# Patient Record
Sex: Female | Born: 1970 | Race: White | Hispanic: No | Marital: Single | Smoking: Never smoker
Health system: Southern US, Community
[De-identification: ages and names within clinical notes are randomized; demographics above are authoritative.]

## PROBLEM LIST (undated history)

## (undated) DIAGNOSIS — J329 Chronic sinusitis, unspecified: Secondary | ICD-10-CM

## (undated) DIAGNOSIS — E049 Nontoxic goiter, unspecified: Secondary | ICD-10-CM

## (undated) DIAGNOSIS — K635 Polyp of colon: Secondary | ICD-10-CM

## (undated) DIAGNOSIS — E039 Hypothyroidism, unspecified: Secondary | ICD-10-CM

## (undated) DIAGNOSIS — E739 Lactose intolerance, unspecified: Secondary | ICD-10-CM

## (undated) HISTORY — DX: Chronic sinusitis, unspecified: J32.9

## (undated) HISTORY — DX: Lactose intolerance, unspecified: E73.9

## (undated) HISTORY — DX: Nontoxic goiter, unspecified: E04.9

## (undated) HISTORY — PX: APPENDECTOMY: SHX54

## (undated) HISTORY — DX: Polyp of colon: K63.5

## (undated) HISTORY — PX: TOTAL THYROIDECTOMY: SHX2547

## (undated) HISTORY — DX: Hypothyroidism, unspecified: E03.9

## (undated) HISTORY — PX: COLONOSCOPY: SHX174

---

## 1996-03-11 DIAGNOSIS — O321XX0 Maternal care for breech presentation, not applicable or unspecified: Secondary | ICD-10-CM

## 2002-10-05 LAB — HM COLONOSCOPY

## 2007-03-29 LAB — HM DEXA SCAN: HM Dexa Scan: NORMAL

## 2009-10-14 ENCOUNTER — Ambulatory Visit: Payer: Self-pay

## 2009-10-16 ENCOUNTER — Ambulatory Visit: Payer: Self-pay

## 2009-11-04 ENCOUNTER — Ambulatory Visit: Payer: Self-pay | Admitting: Internal Medicine

## 2015-03-26 ENCOUNTER — Ambulatory Visit: Payer: Self-pay

## 2015-04-02 ENCOUNTER — Ambulatory Visit
Admission: RE | Admit: 2015-04-02 | Discharge: 2015-04-02 | Disposition: A | Discharge status: Home / Self Care | Payer: Self-pay | Source: Ambulatory Visit | Attending: Oncology | Admitting: Oncology

## 2015-04-02 ENCOUNTER — Ambulatory Visit: Payer: Self-pay | Attending: Oncology

## 2015-04-02 VITALS — BP 112/76 | HR 75 | Temp 98.5°F | Ht 62.6 in | Wt 164.2 lb

## 2015-04-02 DIAGNOSIS — Z0000 Encounter for general adult medical examination without abnormal findings: Secondary | ICD-10-CM

## 2015-04-02 NOTE — Progress Notes (Signed)
Subjective:     Patient ID: Toni Morris, female   DOB: Dec 06, 1970, 45 y.o.   MRN: [id number]  HPI   Review of Systems     Objective:   Physical Exam  Pulmonary/Chest: Right breast exhibits no inverted nipple, no mass, no nipple discharge, no skin change and no tenderness. Left breast exhibits no inverted nipple, no mass, no nipple discharge, no skin change and no tenderness. Breasts are symmetrical.       Assessment:     45 year old patient presents for [facility] clinic visit. Patient screened, and meets BCCCP eligibility.  Patient does not have insurance, Medicare or Medicaid.  Handout given on Affordable Care Act. Instructed patient on breast self-exam using teach back method. CBE unremarkable. No mass or lump palpated.  Patient's mother was diagnosed with breast cancer at age 102 .      Plan:     Sent for bilateral screening mammogram.

## 2015-04-03 NOTE — Progress Notes (Signed)
Letter mailed from [facility] to notify of normal mammogram results.  Patient to return in one year for annual screening.  Copy to [facility]. 

## 2016-04-27 ENCOUNTER — Encounter: Payer: Self-pay | Admitting: Certified Nurse Midwife

## 2016-04-27 ENCOUNTER — Ambulatory Visit (INDEPENDENT_AMBULATORY_CARE_PROVIDER_SITE_OTHER): Payer: Self-pay | Admitting: Certified Nurse Midwife

## 2016-04-27 VITALS — BP 120/80 | HR 70 | Ht 60.0 in | Wt 170.0 lb

## 2016-04-27 DIAGNOSIS — Z01419 Encounter for gynecological examination (general) (routine) without abnormal findings: Secondary | ICD-10-CM

## 2016-04-27 DIAGNOSIS — Z124 Encounter for screening for malignant neoplasm of cervix: Secondary | ICD-10-CM

## 2016-04-27 DIAGNOSIS — Z3042 Encounter for surveillance of injectable contraceptive: Secondary | ICD-10-CM

## 2016-04-30 LAB — IGP, APTIMA HPV
HPV Aptima: NEGATIVE
PAP SMEAR COMMENT: 0

## 2016-05-04 ENCOUNTER — Encounter: Payer: Self-pay | Admitting: Certified Nurse Midwife

## 2016-05-04 DIAGNOSIS — Z309 Encounter for contraceptive management, unspecified: Secondary | ICD-10-CM | POA: Insufficient documentation

## 2016-05-04 DIAGNOSIS — E039 Hypothyroidism, unspecified: Secondary | ICD-10-CM | POA: Insufficient documentation

## 2016-05-04 NOTE — Progress Notes (Signed)
Gynecology Annual Exam  PCP: Farrel Conners, CNM  Chief Complaint:  Chief Complaint  Patient presents with  . Gynecologic Exam    History of Present Illness: Patient is a 46 y.o. G1P0101 presents for annual exam. The patient has no complaints today. She is amenorrheic on Depo Provera. Her last injection was 02/23/2016. She has no spotting. The patient's past medical history is notable for a history of multinodular goiter for which she had a total thyroidectomy and is currently on levothyroxine.  Since her last annual exam 03/12/2015, she has had no significant changes to her health. Her last Pap smear was 03/12/2015 and was negative. The patient is not currently sexually active. She currently uses Depo for cycle control. for contraception.  Her last mammogram was 04/02/2015 and was negative. The patient does perform occasional self breast exams.  There is a notable family history of breast cancer in her mother. Genetic testing has not been done. There is no history ovarian cancer in her family.  The patient has regular exercise: no, but she is active at work.   She does not smoke. She does not drink alcohol. She may get adequate calcium in her diet .     Review of Systems: Review of Systems  Constitutional: Negative for chills, fever and weight loss.  HENT: Negative for congestion, sinus pain and sore throat.   Eyes: Negative for blurred vision and pain.  Respiratory: Negative for hemoptysis, shortness of breath and wheezing.   Cardiovascular: Negative for chest pain, palpitations and leg swelling.  Gastrointestinal: Negative for abdominal pain, blood in stool, diarrhea, heartburn, nausea and vomiting.  Genitourinary: Negative for dysuria, frequency, hematuria and urgency.       Positive for amenorrhea  Musculoskeletal: Negative for back pain, joint pain and myalgias.  Skin: Negative for itching and rash.  Neurological: Negative for dizziness, tingling and headaches.    Endo/Heme/Allergies: Negative for environmental allergies and polydipsia. Does not bruise/bleed easily.       Negative for hirsutism   Psychiatric/Behavioral: Negative for depression. The patient is not nervous/anxious and does not have insomnia.    Past Medical History:  Past Medical History:  Diagnosis Date  . Colon polyps   . Goiter   . Hypothyroidism   . Lactose intolerance   . Sinusitis     Past Surgical History:  Past Surgical History:  Procedure Laterality Date  . APPENDECTOMY    . CESAREAN SECTION  1998   breech, PPROM at 36 weeks  . COLONOSCOPY     polyps in 2004; 2013/2014 colonoscopy WNL  . TOTAL THYROIDECTOMY       Family History:  Family History  Problem Relation Age of Onset  . Breast cancer Mother 76  . Thyroid cancer Mother 21    Social History:  Social History   Social History  . Marital status: Single    Spouse name: N/A  . Number of children: 1  . Years of education: 88   Occupational History  . Owner of tanning salon    Social History Main Topics  . Smoking status: Never Smoker  . Smokeless tobacco: Never Used  . Alcohol use No  . Drug use: No  . Sexual activity: Not Currently    Birth control/ protection: Injection   Other Topics Concern  . Not on file   Social History Narrative  . No narrative on file    Allergies:  No Known Allergies  Medications: Prior to Admission medications   Medication  Sig Start Date End Date Taking? Authorizing Provider  levothyroxine (SYNTHROID, LEVOTHROID) 150 MCG tablet Take by mouth. 05/20/15 05/19/16 Yes Historical Provider, MD  medroxyPROGESTERone (DEPO-PROVERA) 150 MG/ML injection Inject 150 mg into the muscle every 3 (three) months.   Yes Historical Provider, MD    Physical Exam Vitals: Blood pressure 120/80, pulse 70, height 5' (1.524 m), weight 77.1 kg (170 lb).  General: pleasant WF in NAD HEENT: normocephalic, anicteric Thyroid: no enlargement, no palpable nodules Pulmonary: No  increased work of breathing, CTAB Cardiovascular: RRR without murmur Breast: Breast symmetrical, no tenderness, no palpable nodules or masses, no skin or nipple retraction present, no nipple discharge.  No axillary, infraclavicular, or supraclavicular lymphadenopathy. Abdomen: NABS, soft, non-tender, non-distended.  Umbilicus without lesions.  No hepatomegaly or masses palpable. No evidence of hernia  Genitourinary:  External: Normal external female genitalia.  Normal urethral meatus, normal Bartholin's and Skene's glands.    Vagina: Normal vaginal mucosa, no evidence of prolapse.    Cervix: Grossly normal in appearance, no bleeding  Uterus: Non-enlarged, mobile, normal contour, AV, NT  Adnexa: ovaries non-enlarged, no adnexal masses  Rectal: deferred  Lymphatic: no evidence of inguinal lymphadenopathy Extremities: no edema, erythema, or tenderness Neurologic: Grossly intact Psychiatric: mood appropriate, affect full Skin: very tan skin, no rash or lesions      Assessment: 46 y.o. G1P0101 well woman exam Amenorrhea due to Depo Provera  Plan:   1) Mammogram - recommend yearly screening mammogram.  Patient to schedule screening mammogram   2) Refill Depo Provera 150 mgm IM q 12 weeks. Next depo due between 4/8 and 4/22.  3) Pap done  4 Routine healthcare maintenance including cholesterol, diabetes screening discussed to be managed by PCP. Has appt to transfer care to Dr Judithann Sheen next month.   Farrel Conners, CNM

## 2016-05-20 ENCOUNTER — Ambulatory Visit (INDEPENDENT_AMBULATORY_CARE_PROVIDER_SITE_OTHER): Payer: Self-pay

## 2016-05-20 DIAGNOSIS — Z3042 Encounter for surveillance of injectable contraceptive: Secondary | ICD-10-CM

## 2016-05-20 MED ORDER — MEDROXYPROGESTERONE ACETATE 150 MG/ML IM SUSP
150.0000 mg | Freq: Once | INTRAMUSCULAR | Status: AC
  Administered 2016-05-20: 150 mg via INTRAMUSCULAR

## 2016-05-20 MED ORDER — MEDROXYPROGESTERONE ACETATE 150 MG/ML IM SUSP: 150.0000 mg | INTRAMUSCULAR | 3 refills | Status: DC

## 2016-05-20 NOTE — Addendum Note (Signed)
Addended by: Reather Littler D on: 05/20/2016 11:15 AM   Modules accepted: Orders

## 2016-08-09 ENCOUNTER — Other Ambulatory Visit: Payer: Self-pay | Admitting: Certified Nurse Midwife

## 2016-08-09 DIAGNOSIS — Z01419 Encounter for gynecological examination (general) (routine) without abnormal findings: Secondary | ICD-10-CM

## 2016-08-09 DIAGNOSIS — Z3042 Encounter for surveillance of injectable contraceptive: Secondary | ICD-10-CM

## 2016-08-11 ENCOUNTER — Ambulatory Visit (INDEPENDENT_AMBULATORY_CARE_PROVIDER_SITE_OTHER): Payer: Self-pay

## 2016-08-11 DIAGNOSIS — Z308 Encounter for other contraceptive management: Secondary | ICD-10-CM

## 2016-08-11 MED ORDER — MEDROXYPROGESTERONE ACETATE 150 MG/ML IM SUSP
150.0000 mg | Freq: Once | INTRAMUSCULAR | Status: AC
  Administered 2016-08-11: 150 mg via INTRAMUSCULAR

## 2016-08-11 NOTE — Progress Notes (Signed)
Pt here for depo inj which was given IM right glut.  NDC# [id number]

## 2016-11-03 ENCOUNTER — Ambulatory Visit: Payer: Self-pay

## 2016-11-04 ENCOUNTER — Ambulatory Visit (INDEPENDENT_AMBULATORY_CARE_PROVIDER_SITE_OTHER): Payer: Self-pay

## 2016-11-04 DIAGNOSIS — Z308 Encounter for other contraceptive management: Secondary | ICD-10-CM

## 2016-11-04 MED ORDER — MEDROXYPROGESTERONE ACETATE 150 MG/ML IM SUSP
150.0000 mg | Freq: Once | INTRAMUSCULAR | Status: AC
  Administered 2016-11-04: 150 mg via INTRAMUSCULAR

## 2016-11-04 NOTE — Progress Notes (Signed)
Pt here for depo inj which was given IM right glut.  NDC# [phone]

## 2017-01-27 ENCOUNTER — Ambulatory Visit: Payer: Self-pay

## 2017-01-27 DIAGNOSIS — Z3042 Encounter for surveillance of injectable contraceptive: Secondary | ICD-10-CM

## 2017-01-27 MED ORDER — MEDROXYPROGESTERONE ACETATE 150 MG/ML IM SUSP
150.0000 mg | Freq: Once | INTRAMUSCULAR | Status: AC
Start: 2017-01-27 — End: 2017-01-27
  Administered 2017-01-27: 150 mg via INTRAMUSCULAR

## 2017-04-23 ENCOUNTER — Other Ambulatory Visit: Payer: Self-pay | Admitting: Certified Nurse Midwife

## 2017-04-23 DIAGNOSIS — Z01419 Encounter for gynecological examination (general) (routine) without abnormal findings: Secondary | ICD-10-CM

## 2017-04-23 DIAGNOSIS — Z3042 Encounter for surveillance of injectable contraceptive: Secondary | ICD-10-CM

## 2017-04-25 ENCOUNTER — Ambulatory Visit (INDEPENDENT_AMBULATORY_CARE_PROVIDER_SITE_OTHER): Payer: Self-pay

## 2017-04-25 DIAGNOSIS — Z308 Encounter for other contraceptive management: Secondary | ICD-10-CM

## 2017-04-25 MED ORDER — MEDROXYPROGESTERONE ACETATE 150 MG/ML IM SUSP
150.0000 mg | Freq: Once | INTRAMUSCULAR | Status: AC
  Administered 2017-04-25: 150 mg via INTRAMUSCULAR

## 2017-04-25 NOTE — Progress Notes (Signed)
Pt here for depo which was given IM right glut.  NDC# [id number] 

## 2017-05-24 ENCOUNTER — Encounter: Payer: Self-pay | Admitting: Certified Nurse Midwife

## 2017-05-24 ENCOUNTER — Ambulatory Visit (INDEPENDENT_AMBULATORY_CARE_PROVIDER_SITE_OTHER): Payer: Self-pay | Admitting: Certified Nurse Midwife

## 2017-05-24 VITALS — BP 130/70 | HR 87 | Ht 60.0 in | Wt 170.0 lb

## 2017-05-24 DIAGNOSIS — Z01419 Encounter for gynecological examination (general) (routine) without abnormal findings: Secondary | ICD-10-CM

## 2017-05-24 DIAGNOSIS — Z1231 Encounter for screening mammogram for malignant neoplasm of breast: Secondary | ICD-10-CM

## 2017-05-24 DIAGNOSIS — Z0000 Encounter for general adult medical examination without abnormal findings: Secondary | ICD-10-CM

## 2017-05-24 DIAGNOSIS — Z3042 Encounter for surveillance of injectable contraceptive: Secondary | ICD-10-CM

## 2017-05-24 DIAGNOSIS — Z1239 Encounter for other screening for malignant neoplasm of breast: Secondary | ICD-10-CM

## 2017-05-24 NOTE — Progress Notes (Signed)
Gynecology Annual Exam  PCP: Farrel ConnersGutierrez, Danny Zimny, CNM  Chief Complaint:  Chief Complaint  Patient presents with  . Gynecologic Exam    History of Present Illness: Patient is a 47 y.o. WF G1P0101 who  presents for her annual exam. The patient has no complaints today. She is amenorrheic on Depo Provera. Her last injection was 04/25/2017.  She has no spotting. The patient's past medical history is notable for a history of multinodular goiter for which she had a total thyroidectomy and is currently on levothyroxine.  Since her last annual exam 04/27/2016, she has had no significant changes to her health. Her last Pap smear was 04/27/2016 and was NIL/negative HRHPV The patient is not currently sexually active. She currently uses Depo for cycle control. for contraception.  Her last mammogram was 04/02/2015 and was negative. The patient does perform monthly self breast exams.  There is a notable family history of breast cancer in her mother. Genetic testing has not been done. There is no history ovarian cancer in her family.  The patient has regular exercise: no, but she gets her steps at work.   She does not smoke. She does not drink alcohol. She may get adequate calcium in her diet and supplements.     Review of Systems: Review of Systems  Constitutional: Negative for chills, fever and weight loss.  HENT: Positive for congestion. Negative for sinus pain and sore throat.   Eyes: Negative for blurred vision and pain.  Respiratory: Negative for hemoptysis, shortness of breath and wheezing.   Cardiovascular: Negative for chest pain, palpitations and leg swelling.  Gastrointestinal: Negative for abdominal pain, blood in stool, diarrhea, heartburn, nausea and vomiting.  Genitourinary: Negative for dysuria, frequency, hematuria and urgency.       Positive for amenorrhea  Musculoskeletal: Negative for back pain, joint pain and myalgias.  Skin: Negative for itching and rash.  Neurological:  Negative for dizziness, tingling and headaches.  Endo/Heme/Allergies: Positive for environmental allergies. Negative for polydipsia. Does not bruise/bleed easily.       Negative for hirsutism   Psychiatric/Behavioral: Negative for depression. The patient is not nervous/anxious and does not have insomnia.    Past Medical History:  Past Medical History:  Diagnosis Date  . Colon polyps   . Goiter   . Hypothyroidism   . Lactose intolerance   . Sinusitis     Past Surgical History:  Past Surgical History:  Procedure Laterality Date  . APPENDECTOMY    . CESAREAN SECTION  1998   breech, PPROM at 36 weeks  . COLONOSCOPY     polyps in 2004; 2013/2014 colonoscopy WNL  . TOTAL THYROIDECTOMY       Family History:  Family History  Problem Relation Age of Onset  . Breast cancer Mother 1951  . Thyroid cancer Mother 2940    Social History:  Social History   Socioeconomic History  . Marital status: Single    Spouse name: Not on file  . Number of children: 1  . Years of education: 3816  . Highest education level: Not on file  Occupational History  . Occupation: Network engineerwner of tanning salon  Social Needs  . Financial resource strain: Not on file  . Food insecurity:    Worry: Not on file    Inability: Not on file  . Transportation needs:    Medical: Not on file    Non-medical: Not on file  Tobacco Use  . Smoking status: Never Smoker  . Smokeless tobacco:  Never Used  Substance and Sexual Activity  . Alcohol use: No  . Drug use: No  . Sexual activity: Not Currently    Birth control/protection: Injection  Lifestyle  . Physical activity:    Days per week: 0 days    Minutes per session: 0 min  . Stress: Only a little  Relationships  . Social connections:    Talks on phone: Not on file    Gets together: Not on file    Attends religious service: Not on file    Active member of club or organization: Not on file    Attends meetings of clubs or organizations: Not on file     Relationship status: Not on file  . Intimate partner violence:    Fear of current or ex partner: Not on file    Emotionally abused: Not on file    Physically abused: Not on file    Forced sexual activity: Not on file  Other Topics Concern  . Not on file  Social History Narrative  . Not on file    Allergies:  No Known Allergies  Medications: Prior to Admission medications   Medication Sig Start Date End Date Taking? Authorizing Provider  levothyroxine (SYNTHROID, LEVOTHROID) 150 MCG tablet Take by mouth. 05/20/15 05/19/16 Yes Historical Provider, MD  medroxyPROGESTERone (DEPO-PROVERA) 150 MG/ML injection Inject 150 mg into the muscle every 3 (three) months.   Yes Historical Provider, MD   Isagenix supplements   Physical Exam Vitals:BP 130/70   Pulse 87   Ht 5' (1.524 m)   Wt 170 lb (77.1 kg)   BMI 33.20 kg/m   General: pleasant WF in NAD HEENT: normocephalic, anicteric Thyroid: no enlargement, no palpable nodules Pulmonary: No increased work of breathing, CTAB Cardiovascular: RRR without murmur Breast: Breast symmetrical, no tenderness, no palpable nodules or masses, no skin or nipple retraction present, no nipple discharge.  No axillary, infraclavicular, or supraclavicular lymphadenopathy. Abdomen: soft, non-tender, non-distended.  Umbilicus without lesions.  No hepatomegaly or masses palpable. No evidence of hernia  Genitourinary:  External: Normal external female genitalia.  Normal urethral meatus, normal Bartholin's and Skene's glands.    Vagina: Normal vaginal mucosa, no evidence of prolapse.    Cervix: Grossly normal in appearance, no bleeding  Uterus: Non-enlarged, mobile, normal contour, AV, NT  Adnexa: ovaries non-enlarged, no adnexal masses  Rectal: deferred  Lymphatic: no evidence of inguinal lymphadenopathy Extremities: no edema, erythema, or tenderness Neurologic: Grossly intact Psychiatric: mood appropriate, affect full Skin: very tan skin, no rash or  lesions      Assessment: 47 y.o. G1P0101 well woman exam Amenorrhea due to Depo Provera  Plan:   1) Mammogram - recommend yearly screening mammogram.  Patient to schedule screening mammogram   2) Refill Depo Provera 150 mgm IM q 12 weeks. Next depo due 17 June +/- 1 week  3) Pap not done. Desires Pap smear every 3 years after discussion  4 Routine healthcare maintenance including cholesterol, diabetes screening to be managed by PCP  5) RTO 1 year for annual exam.   Farrel Conners, CNM

## 2017-05-26 MED ORDER — MEDROXYPROGESTERONE ACETATE 150 MG/ML IM SUSP: 150.0000 mg | INTRAMUSCULAR | 3 refills | Status: DC

## 2017-07-18 ENCOUNTER — Ambulatory Visit: Payer: Self-pay

## 2017-07-18 DIAGNOSIS — Z3042 Encounter for surveillance of injectable contraceptive: Secondary | ICD-10-CM

## 2017-07-18 MED ORDER — MEDROXYPROGESTERONE ACETATE 150 MG/ML IM SUSP
150.0000 mg | Freq: Once | INTRAMUSCULAR | Status: AC
  Administered 2017-07-18: 150 mg via INTRAMUSCULAR

## 2017-10-10 ENCOUNTER — Ambulatory Visit: Payer: Self-pay

## 2017-10-13 ENCOUNTER — Ambulatory Visit (INDEPENDENT_AMBULATORY_CARE_PROVIDER_SITE_OTHER): Payer: Self-pay

## 2017-10-13 DIAGNOSIS — Z3042 Encounter for surveillance of injectable contraceptive: Secondary | ICD-10-CM

## 2017-10-13 MED ORDER — MEDROXYPROGESTERONE ACETATE 150 MG/ML IM SUSP
150.0000 mg | Freq: Once | INTRAMUSCULAR | Status: AC
  Administered 2017-10-13: 150 mg via INTRAMUSCULAR

## 2018-01-05 ENCOUNTER — Ambulatory Visit (INDEPENDENT_AMBULATORY_CARE_PROVIDER_SITE_OTHER): Payer: Self-pay

## 2018-01-05 DIAGNOSIS — Z3042 Encounter for surveillance of injectable contraceptive: Secondary | ICD-10-CM

## 2018-01-05 MED ORDER — MEDROXYPROGESTERONE ACETATE 150 MG/ML IM SUSP
150.0000 mg | Freq: Once | INTRAMUSCULAR | Status: AC
  Administered 2018-01-05: 150 mg via INTRAMUSCULAR

## 2018-04-06 ENCOUNTER — Ambulatory Visit: Payer: Self-pay

## 2018-04-06 DIAGNOSIS — Z3042 Encounter for surveillance of injectable contraceptive: Secondary | ICD-10-CM

## 2018-04-06 MED ORDER — MEDROXYPROGESTERONE ACETATE 150 MG/ML IM SUSP
150.0000 mg | Freq: Once | INTRAMUSCULAR | Status: AC
  Administered 2018-04-06: 150 mg via INTRAMUSCULAR

## 2018-06-19 ENCOUNTER — Other Ambulatory Visit: Payer: Self-pay

## 2018-06-19 DIAGNOSIS — Z01419 Encounter for gynecological examination (general) (routine) without abnormal findings: Secondary | ICD-10-CM

## 2018-06-19 DIAGNOSIS — Z3042 Encounter for surveillance of injectable contraceptive: Secondary | ICD-10-CM

## 2018-06-19 MED ORDER — MEDROXYPROGESTERONE ACETATE 150 MG/ML IM SUSP: 150.0000 mg | INTRAMUSCULAR | 0 refills | Status: DC

## 2018-06-19 NOTE — Telephone Encounter (Signed)
Pt calling for refill of depo as annual is scheduled for after depo is due.  [phone]  Left detailed msg rx sent to pharm.

## 2018-06-28 ENCOUNTER — Ambulatory Visit: Payer: Self-pay

## 2018-06-29 ENCOUNTER — Other Ambulatory Visit: Payer: Self-pay

## 2018-06-29 ENCOUNTER — Ambulatory Visit (INDEPENDENT_AMBULATORY_CARE_PROVIDER_SITE_OTHER): Payer: Self-pay | Admitting: Certified Nurse Midwife

## 2018-06-29 ENCOUNTER — Encounter: Payer: Self-pay | Admitting: Certified Nurse Midwife

## 2018-06-29 VITALS — BP 130/80 | Ht 60.0 in | Wt 183.2 lb

## 2018-06-29 DIAGNOSIS — Z01419 Encounter for gynecological examination (general) (routine) without abnormal findings: Secondary | ICD-10-CM

## 2018-06-29 DIAGNOSIS — Z803 Family history of malignant neoplasm of breast: Secondary | ICD-10-CM

## 2018-06-29 DIAGNOSIS — Z1239 Encounter for other screening for malignant neoplasm of breast: Secondary | ICD-10-CM

## 2018-06-29 DIAGNOSIS — Z3042 Encounter for surveillance of injectable contraceptive: Secondary | ICD-10-CM

## 2018-06-29 MED ORDER — MEDROXYPROGESTERONE ACETATE 150 MG/ML IM SUSP
150.0000 mg | Freq: Once | INTRAMUSCULAR | Status: AC
  Administered 2018-06-29: 08:00:00 150 mg via INTRAMUSCULAR

## 2018-06-29 MED ORDER — MEDROXYPROGESTERONE ACETATE 150 MG/ML IM SUSP: 150.0000 mg | INTRAMUSCULAR | 3 refills | Status: DC

## 2018-06-29 NOTE — Progress Notes (Signed)
Gynecology Annual Exam  PCP: Farrel Conners, CNM  Chief Complaint:  Chief Complaint  Patient presents with  . Gynecologic Exam  . Contraception    depo to be given     History of Present Illness: Patient is a 48 y.o. WF G1P0101 who  presents for her annual exam and Depo injection. The patient has no gyn complaints today. She reports several episodes of mild RUQ pain in the last 6 months. The pain lasts for 10-15 minutes and resolves. Does not seem to be linked to eating. Denies nausea and vomiting with pain.  She is amenorrheic on Depo Provera. Her last injection was 04/06/2018.  She has no spotting. The patient's past medical history is notable for a history of multinodular goiter for which she had a total thyroidectomy and is currently on levothyroxine 150 mcg M-Sat. Was seeing Dr Renae Fickle in endocrinology, but since Dr Renae Fickle has moved, she is transferring care to Dr Althea Charon in [facility]. Since her last annual exam 05/24/2017, she has had no significant changes to her health. Her last Pap smear was 04/27/2016 and was NIL/negative HRHPV The patient is not currently sexually active. She currently uses Depo for cycle control.  Her last mammogram was 04/02/2015 and was negative. She has not been able to afford mammograms as she is self pay.The patient does perform monthly self breast exams.  There is a notable family history of breast cancer in her mother. Genetic testing has not been done. There is no history ovarian cancer in her family. Last colonoscopy 2014 was normal. Next due in 2024 The patient has regular exercise: no, but she gets her steps at work.   She does not smoke. She does not drink alcohol. She may get adequate calcium in her diet and supplements.     Review of Systems: Review of Systems  Constitutional: Negative for chills, fever and weight loss.  HENT: Negative for congestion, sinus pain and sore throat.   Eyes: Negative for blurred vision and pain.   Respiratory: Negative for hemoptysis, shortness of breath and wheezing.   Cardiovascular: Negative for chest pain, palpitations and leg swelling.  Gastrointestinal: Negative for abdominal pain, blood in stool, diarrhea, heartburn, nausea and vomiting.  Genitourinary: Negative for dysuria, frequency, hematuria and urgency.       Positive for amenorrhea  Musculoskeletal: Negative for back pain, joint pain and myalgias.  Skin: Negative for itching and rash.  Neurological: Negative for dizziness, tingling and headaches.  Endo/Heme/Allergies: Positive for environmental allergies. Negative for polydipsia. Does not bruise/bleed easily.       Negative for hirsutism   Psychiatric/Behavioral: Negative for depression. The patient is not nervous/anxious and does not have insomnia.    Past Medical History:  Past Medical History:  Diagnosis Date  . Colon polyps   . Goiter   . Hypothyroidism   . Lactose intolerance   . Sinusitis     Past Surgical History:  Past Surgical History:  Procedure Laterality Date  . APPENDECTOMY    . CESAREAN SECTION  1998   breech, PPROM at 36 weeks  . COLONOSCOPY     polyps in 2004; 2013/2014 colonoscopy WNL  . TOTAL THYROIDECTOMY       Family History:  Family History  Problem Relation Age of Onset  . Breast cancer Mother 13  . Thyroid cancer Mother 55    Social History:  Social History   Socioeconomic History  . Marital status: Single    Spouse  name: Not on file  . Number of children: 1  . Years of education: 25  . Highest education level: Not on file  Occupational History  . 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 Financial resource strain: Not on file  . Food insecurity:    Worry: Not on file    Inability: Not on file  . Transportation needs:    Medical: Not on file    Non-medical: Not on file  Tobacco Use  . Smoking status: Never Smoker  . Smokeless tobacco: Never Used  Substance and Sexual Activity  . Alcohol use: No  .  Drug use: No  . Sexual activity: Not Currently    Birth control/protection: Injection  Lifestyle  . Physical activity:    Days per week: 0 days    Minutes per session: 0 min  . Stress: Only a little  Relationships  . Social connections:    Talks on phone: Not on file    Gets together: Not on file    Attends religious service: Not on file    Active member of club or organization: Not on file    Attends meetings of clubs or organizations: Not on file    Relationship status: Not on file  . Intimate partner violence:    Fear of current or ex partner: Not on file    Emotionally abused: Not on file    Physically abused: Not on file    Forced sexual activity: Not on file  Other Topics Concern  . Not on file  Social History Narrative  . Not on file    Allergies:  No Known Allergies  Medications: Prior to Admission medications   Medication Sig Start Date End Date Taking? Authorizing Provider  levothyroxine (SYNTHROID, LEVOTHROID) 150 MCG tablet Take by mouth. 05/20/15 05/19/16 Yes Historical Provider, MD  medroxyPROGESTERone (DEPO-PROVERA) 150 MG/ML injection Inject 150 mg into the muscle every 3 (three) months.   Yes Historical Provider, MD   Isagenix supplements   Physical Exam Vitals:BP 130/80   Ht 5' (1.524 m)   Wt 183 lb 3.2 oz (83.1 kg)   BMI 35.78 kg/m   General: pleasant WF in NAD HEENT: normocephalic, anicteric Thyroid: no enlargement, no palpable nodules Pulmonary: No increased work of breathing, CTAB Cardiovascular: RRR without murmur Breast: Breast symmetrical, no tenderness, no palpable nodules or masses, no skin or nipple retraction present, no nipple discharge.  No axillary, infraclavicular, or supraclavicular lymphadenopathy. Abdomen: soft, non-tender, non-distended.  No RUQ tenderness to palpation. Umbilicus without lesions.  No hepatomegaly or masses palpable. No evidence of hernia  Genitourinary:  External: Normal external female genitalia.  Normal  urethral meatus, normal Bartholin's and Skene's glands.    Vagina: Normal vaginal mucosa, no evidence of prolapse.    Cervix: Grossly normal in appearance, no bleeding  Uterus: Non-enlarged, mobile, normal contour, AV, NT  Adnexa: ovaries non-enlarged, no adnexal masses  Rectal: deferred  Lymphatic: no evidence of inguinal lymphadenopathy Extremities: no edema, erythema, or tenderness Neurologic: Grossly intact Psychiatric: mood appropriate, affect full Skin:  no rash or lesions      Assessment: 48 y.o. G1P0101 well woman exam Amenorrhea due to Depo Provera Family history of breast cancer-mother  Plan:   1) Mammogram - recommend yearly screening mammograms.  Will consult BCCCP to schedule mammogram and assist with cost of mammogram.  2) Given Depo Provera injection today. Refill Depo Provera 150 mgm IM q 12 weeks. Next depo due in 12 weeks  3) Pap  not done. Desires Pap smear every 3 years after discussion. Due next year  4) Routine healthcare maintenance including cholesterol, diabetes screening to be managed by PCP  5) RTO 1 year for annual exam. Discussed stopping Depo at that time and checking FSH/ LH  Farrel Connersolleen Katelyn Kohlmeyer, CNM

## 2018-07-26 ENCOUNTER — Ambulatory Visit: Payer: Self-pay | Admitting: Family Medicine

## 2018-07-26 ENCOUNTER — Other Ambulatory Visit: Payer: Self-pay

## 2018-07-26 ENCOUNTER — Encounter: Payer: Self-pay | Admitting: Family Medicine

## 2018-07-26 VITALS — BP 138/80 | Temp 98.7°F | Ht 61.5 in | Wt 184.4 lb

## 2018-07-26 DIAGNOSIS — G4700 Insomnia, unspecified: Secondary | ICD-10-CM | POA: Insufficient documentation

## 2018-07-26 DIAGNOSIS — Z7689 Persons encountering health services in other specified circumstances: Secondary | ICD-10-CM

## 2018-07-26 DIAGNOSIS — F5101 Primary insomnia: Secondary | ICD-10-CM

## 2018-07-26 DIAGNOSIS — E669 Obesity, unspecified: Secondary | ICD-10-CM

## 2018-07-26 DIAGNOSIS — E039 Hypothyroidism, unspecified: Secondary | ICD-10-CM

## 2018-07-26 DIAGNOSIS — M62830 Muscle spasm of back: Secondary | ICD-10-CM

## 2018-07-26 MED ORDER — BACLOFEN 10 MG PO TABS: 5.0000 mg | ORAL_TABLET | Freq: Two times a day (BID) | ORAL | 2 refills | Status: DC | PRN

## 2018-07-26 NOTE — Assessment & Plan Note (Signed)
Encourage lifestyle diet exercise wt loss 

## 2018-07-26 NOTE — Progress Notes (Signed)
Subjective:    Patient ID: Toni Morris, female    DOB: 03-11-1970, 48 y.o.   MRN: [id number]  Toni Morris is a 48 y.o. female presenting on 07/26/2018 for Establish Care (need a medication renewal on thyroid medication ) and Hypothyroidism  Previously followed by Endocrinology [facility] and [place]. No regular PCP.  HPI   Hypothyroidism / Obesity BMI >34 Chronic problem since 2011, dx with multinodular goiter in 2011 on thyroid ultrasound, and then next year 07/2010 had total thyroidectomy 07/15/10, benign surgical pathology, then on replacement thyroid hormone, levothyroxine and later synthroid, then back to levothyroxine dosage 100s to 150s. Last year was on 167mcg daily except on Sundays - Currently taking Levothyroxine 164mcg daily, except Sunday - Last lab TSH mild low 0.1, in 05/2017, due for lab today, she is fasting - Denies feeling any significant change or symptoms from hypothyroidism Denies abnormal weight gain, skin or hair or nail changes, temperature changes   Insomnia Additional issue, has had long term problem of insomnia, she was advised to take Melatonin a year ago, in past she started with Melatonin 10mg  nightly in past, and then she reduced dose to 5 mg and did better, it does help some, initially helped her go to sleep sooner, but if wake up overnight she will remain awake. - Describes sleep pattern with able to fall asleep within 30 min avg, going to bed around 10pm, then often can wake up 5 out of 7 days a week around 1am, usually weekdays, works later on weekend so able to sleep better, then difficulty falling back asleep, she may get up for 30 min then go back bed and tries to fall asleep, admits to using computers and artificial light at time before bed - No caffeine, only water. 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 - Never on other rx for this Denies radiating pain into legs, numbness tingling weakness, fall or injury trauma  Health Maintenance: Followed by [facility]  Depression screen [facility] 2/9 07/26/2018  Decreased Interest 0  Down, Depressed, Hopeless 0  PHQ - 2 Score 0    Past Medical History:  Diagnosis Date  . Colon polyps   . Goiter   . Lactose intolerance    Past Surgical History:  Procedure Laterality Date  . APPENDECTOMY    . CESAREAN SECTION  1998   breech, PPROM at 40 weeks  . COLONOSCOPY     polyps in 2004; 2013/2014 colonoscopy WNL  . TOTAL THYROIDECTOMY     Social History   Socioeconomic History  . Marital status: Single    Spouse name: Not on file  . Number of children: 1  . Years of education: 89  . Highest education level: Not on file  Occupational History  . 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 Financial resource strain: Not on file  . Food insecurity    Worry: Not on file    Inability: Not on file  . Transportation needs    Medical: Not on file  Non-medical: Not on file  Tobacco Use  . Smoking status: Never Smoker  . Smokeless tobacco: Never Used  Substance and Sexual Activity  . Alcohol use: No  . Drug use: No  . Sexual activity: Not Currently    Birth control/protection: Injection  Lifestyle  . Physical activity    Days per week: 0 days    Minutes per session: 0 min  . Stress: Only a little  Relationships  . Social Musicianconnections    Talks on phone: Not on file    Gets together: Not on file    Attends religious service: Not on file    Active member of club or organization: Not on file    Attends meetings of clubs or organizations: Not on  file    Relationship status: Not on file  . Intimate partner violence    Fear of current or ex partner: Not on file    Emotionally abused: Not on file    Physically abused: Not on file    Forced sexual activity: Not on file  Other Topics Concern  . Not on file  Social History Narrative  . Not on file   Family History  Problem Relation Age of Onset  . Breast cancer Mother 1952  . Thyroid cancer Mother 7140  . Alcohol abuse Father   . Alcohol abuse Paternal Grandfather    Current Outpatient Medications on File Prior to Visit  Medication Sig  . aspirin-acetaminophen-caffeine (EXCEDRIN MIGRAINE) 250-250-65 MG tablet Take 2 tablets by mouth daily.  Melene Muller. [START ON 09/16/2018] medroxyPROGESTERone (DEPO-PROVERA) 150 MG/ML injection Inject 1 mL (150 mg total) into the muscle every 3 (three) months.  . Melatonin 5 MG CAPS Take by mouth.  . levothyroxine (SYNTHROID, LEVOTHROID) 150 MCG tablet Take by mouth.   No current facility-administered medications on file prior to visit.     Review of Systems Per HPI unless specifically indicated above     Objective:    BP 138/80 (BP Location: Left Arm, Cuff Size: Normal)   Temp 98.7 F (37.1 C) (Oral)   Ht 5' 1.5" (1.562 m)   Wt 184 lb 6.4 oz (83.6 kg)   BMI 34.28 kg/m   Wt Readings from Last 3 Encounters:  07/26/18 184 lb 6.4 oz (83.6 kg)  06/29/18 183 lb 3.2 oz (83.1 kg)  05/24/17 170 lb (77.1 kg)    Physical Exam Vitals signs and nursing note reviewed.  Constitutional:      General: She is not in acute distress.    Appearance: She is well-developed. She is not diaphoretic.     Comments: Well-appearing, comfortable, cooperative  HENT:     Head: Normocephalic and atraumatic.  Eyes:     General:        Right eye: No discharge.        Left eye: No discharge.     Conjunctiva/sclera: Conjunctivae normal.  Neck:     Musculoskeletal: Normal range of motion and neck supple.     Thyroid: No thyromegaly.  Cardiovascular:     Rate and  Rhythm: Normal rate and regular rhythm.     Heart sounds: Normal heart sounds. No murmur.  Pulmonary:     Effort: Pulmonary effort is normal. No respiratory distress.     Breath sounds: Normal breath sounds. No wheezing or rales.  Musculoskeletal: Normal range of motion.     Comments: Mild tender to palpation across most paraspinal muscles thoracic and lumbar, some mild increased muscle hypertonicity but no focal injury  Lymphadenopathy:     Cervical: No cervical adenopathy.  Skin:    General: Skin is warm and dry.     Findings: No erythema or rash.  Neurological:     Mental Status: She is alert and oriented to person, place, and time.  Psychiatric:        Behavior: Behavior normal.     Comments: Well groomed, good eye contact, normal speech and thoughts      Results for orders placed or performed in visit on 04/27/16  IGP, Aptima HPV  Result Value Ref Range   DIAGNOSIS: Comment    Specimen adequacy: Comment    Clinician Provided ICD10 Comment    Performed by: Comment    QC reviewed by: Comment    PAP Smear Comment .    Note: Comment    Test Methodology Comment    HPV Aptima Negative Negative      Assessment & Plan:   Problem List Items Addressed This Visit    Hypothyroidism (acquired) - Primary    Currently controlled hypothyroid On levothyroxine 150mcg daily, has enough pills for a week Due for tsh, last > 1 year ago, will draw today, f/u result Order rx if need refill or adjust dose Next 1  Year labs      Relevant Orders   TSH   T4, free   Insomnia    Clinically without clear etiology for her insomnia, other than several lifestyle factors Seems mostly sleep maintenance insomnia, wakes up, diff fall back asleep Likely related to long work hours, stress, unable to rest, also associated back muscle strain related to work, taking excedrin with caffeine in it  Plan - Trial off Excedrin, Switch to baclofen muscle relaxant, counseling provided on safe usage,  sedation, benefits - Sleep hygiene handout - Improve exercise, de-stress techniques, and sensory techniques fall back asleep      Obesity (BMI 30.0-34.9)    Encourage lifestyle diet exercise wt loss       Other Visit Diagnoses    Muscle spasm of back       Relevant Medications   baclofen (LIORESAL) 10 MG tablet   Encounter to establish care with new doctor        likely muscular tension vs strain from work, positioning standing Advise can trial baclofen muscle relaxant, tylenol PRN Future consider chiropractor ./ massage Follow-up   Meds ordered this encounter  Medications  . baclofen (LIORESAL) 10 MG tablet    Sig: Take 0.5-1 tablets (5-10 mg total) by mouth 2 (two) times daily as needed for muscle spasms.    Dispense:  60 each    Refill:  2     Follow up plan: Return in about 1 year (around 07/26/2019) for Yearly Checkup, thyroid.    Saralyn PilarAlexander Karamalegos, DO [facility] 07/26/2018, 8:27 AM

## 2018-07-26 NOTE — Assessment & Plan Note (Signed)
Clinically without clear etiology for her insomnia, other than several lifestyle factors Seems mostly sleep maintenance insomnia, wakes up, diff fall back asleep Likely related to long work hours, stress, unable to rest, also associated back muscle strain related to work, taking excedrin with caffeine in it  Plan - Trial off Excedrin, Switch to baclofen muscle relaxant, counseling provided on safe usage, sedation, benefits - Sleep hygiene handout - Improve exercise, de-stress techniques, and sensory techniques fall back asleep

## 2018-07-26 NOTE — Patient Instructions (Addendum)
Thank you for coming to the office today.  Labs today, stay tuned for results, hopefully by end of week - if TSH is good, then can refill Levothyroxine 136mcg daily except sundays. Stay tuned.  Start taking Baclofen (Lioresal) 10mg  (muscle relaxant) - start with half (cut) to one whole pill at night as needed for next 1-3 nights (may make you drowsy, caution with driving) see how it affects you, then if tolerated increase to half to one whole pill 2 times a day  Can reduce Excedrin and Aleve now.   Other one that is more sedating is Flexeril (Cyclobenzaprine) consider this option for the future ------------------------  Sleep Hygiene Recommendations to promote healthy sleep in all patients, especially if symptoms of insomnia are worsening. Due to the nature of sleep rhythms, if your body gets "out of rhythm", it may take some time before your sleep cycle can be "reset".  Please try to follow as many of the following tips as you can, usually there are only a few of these are the primary cause of the problem.  ?To reset your sleep rhythm, go to bed and get up at the same time every day ?Sleep only long enough to feel rested and then get out of bed ?Do not try to force yourself to sleep. If you can't sleep, get out of bed and try again later. ?Avoid naps during the day, unless excessively tired. The more sleeping during the day, then the less sleep your body needs at night.  ?Have coffee, tea, and other foods that have caffeine only in the morning ?Exercise several days a week, but not right before bed ?If you drink alcohol, prefer to have appropriate drink with one meal, but prefer to avoid alcohol in the evening, and bedtime ?If you smoke, avoid smoking, especially in the evening  ?Avoid watching TV or looking at phones, computers, or reading devices ("e-books") that give off light at least 30 minutes before bed. This artificial light sends "awake signals" to your brain and can make it  harder to fall asleep. ?Make your bedroom a comfortable place where it is easy to fall asleep: ? Put up shades or special blackout curtains to block light from outside. ? Use a white noise machine to block noise. ? Keep the temperature cool. ?Try your best to solve or at least address your problems before you go to bed ?Use relaxation techniques to manage stress. Ask your health care provider to suggest some techniques that may work well for you. These may include: ? Breathing exercises. ? Routines to release muscle tension. ? Visualizing peaceful scenes.  Sensory exercise cycle through different sense - sight, sound, taste, smell, touch  DUE for FASTING BLOOD WORK (no food or drink after midnight before the lab appointment, only water or coffee without cream/sugar on the morning of)  SCHEDULE "Lab Only" visit in the morning at the clinic for lab draw in 1 YEAR  - Make sure Lab Only appointment is at about 1 week before your next appointment, so that results will be available  For Lab Results, once available within 2-3 days of blood draw, you can can log in to MyChart online to view your results and a brief explanation. Also, we can discuss results at next follow-up visit.    Please schedule a Follow-up Appointment to: Return in about 1 year (around 07/26/2019) for Yearly Checkup, thyroid.  If you have any other questions or concerns, please feel free to call the office or send  a message through [facility]. You may also schedule an earlier appointment if necessary.  Additionally, you may be receiving a survey about your experience at our office within a few days to 1 week by e-mail or mail. We value your feedback.  Nobie Putnam, DO [place]

## 2018-07-26 NOTE — Assessment & Plan Note (Signed)
Currently controlled hypothyroid On levothyroxine 191mcg daily, has enough pills for a week Due for tsh, last > 1 year ago, will draw today, f/u result Order rx if need refill or adjust dose Next 1  Year labs

## 2018-07-27 ENCOUNTER — Other Ambulatory Visit: Payer: Self-pay | Admitting: Family Medicine

## 2018-07-27 ENCOUNTER — Ambulatory Visit: Payer: Self-pay | Admitting: Family Medicine

## 2018-07-27 DIAGNOSIS — E039 Hypothyroidism, unspecified: Secondary | ICD-10-CM

## 2018-07-27 LAB — TSH: TSH: 2.35 mIU/L

## 2018-07-27 LAB — T4, FREE: Free T4: 1.3 ng/dL (ref 0.8–1.8)

## 2018-07-27 MED ORDER — LEVOTHYROXINE SODIUM 150 MCG PO TABS: 150.0000 ug | ORAL_TABLET | Freq: Every day | ORAL | 3 refills | Status: DC

## 2018-09-21 ENCOUNTER — Other Ambulatory Visit: Payer: Self-pay

## 2018-09-21 ENCOUNTER — Ambulatory Visit (INDEPENDENT_AMBULATORY_CARE_PROVIDER_SITE_OTHER): Payer: Self-pay

## 2018-09-21 DIAGNOSIS — Z3042 Encounter for surveillance of injectable contraceptive: Secondary | ICD-10-CM

## 2018-09-21 MED ORDER — MEDROXYPROGESTERONE ACETATE 150 MG/ML IM SUSP
150.0000 mg | Freq: Once | INTRAMUSCULAR | Status: AC
  Administered 2018-09-21: 150 mg via INTRAMUSCULAR

## 2018-09-21 NOTE — Progress Notes (Signed)
Pt given Depo in LUOQ tolerated well

## 2018-09-29 ENCOUNTER — Telehealth: Payer: Self-pay | Admitting: *Deleted

## 2018-10-20 ENCOUNTER — Telehealth: Payer: Self-pay | Admitting: Certified Nurse Midwife

## 2018-10-20 NOTE — Telephone Encounter (Signed)
Called and left voicemail to contact Carlton or Sheena at the [facility] at [facility] for referral for screening breast cancer and Family history of breast cancer in Mother

## 2018-10-23 NOTE — Telephone Encounter (Signed)
Called and left voicemail for patient to call back to be schedule. Contact [facility] [phone]

## 2018-11-20 ENCOUNTER — Other Ambulatory Visit: Payer: Self-pay | Admitting: Family Medicine

## 2018-11-20 DIAGNOSIS — M62830 Muscle spasm of back: Secondary | ICD-10-CM

## 2018-12-04 ENCOUNTER — Telehealth: Payer: Self-pay

## 2018-12-04 NOTE — Telephone Encounter (Signed)
Pre-visit call attempted prior to [facility] appointment on 12/06/2018. No answer / msg left.

## 2018-12-06 ENCOUNTER — Ambulatory Visit: Payer: Self-pay | Attending: Oncology

## 2018-12-06 ENCOUNTER — Ambulatory Visit
Admission: RE | Admit: 2018-12-06 | Discharge: 2018-12-06 | Disposition: A | Discharge status: Home / Self Care | Payer: Self-pay | Source: Ambulatory Visit | Attending: Oncology | Admitting: Oncology

## 2018-12-06 ENCOUNTER — Other Ambulatory Visit: Payer: Self-pay

## 2018-12-06 VITALS — BP 147/82 | HR 85 | Temp 98.9°F | Resp 16 | Ht 63.0 in | Wt 192.0 lb

## 2018-12-06 DIAGNOSIS — Z0000 Encounter for general adult medical examination without abnormal findings: Secondary | ICD-10-CM

## 2018-12-06 NOTE — Progress Notes (Signed)
  Subjective:     Patient ID: Toni Morris, female   DOB: 11/20/1970, 48 y.o.   MRN: [id number]  HPI   Review of Systems     Objective:   Physical Exam Chest:     Breasts:        Right: No swelling, bleeding, inverted nipple, mass, nipple discharge, skin change or tenderness.        Left: No swelling, bleeding, inverted nipple, mass, nipple discharge, skin change or tenderness.        Assessment:     48 year old patient presents for [facility] clinic visit. Patient's mother diagnosed with breast cancer in her late 37's or early 6's.  Patient screened, and meets [facility] eligibility.  Patient does not have insurance, Medicare or Medicaid. Instructed patient on breast self awareness using teach back method.  Clinical breast exam unremarkable.  No mass or lump palpated. Risk Assessment    Risk Scores      12/06/2018   Last edited by: Rico Junker, RN   5-year risk: 1.8 %   Lifetime risk: 17.1 %            Plan:     Sent for bilateral screening mammogram.  Tyrer Cusick score 22.2.  Patient wants to see if financial assistance available for high risk clinic.

## 2018-12-08 ENCOUNTER — Other Ambulatory Visit: Payer: Self-pay

## 2018-12-08 ENCOUNTER — Ambulatory Visit (INDEPENDENT_AMBULATORY_CARE_PROVIDER_SITE_OTHER): Payer: Self-pay

## 2018-12-08 DIAGNOSIS — Z3042 Encounter for surveillance of injectable contraceptive: Secondary | ICD-10-CM

## 2018-12-08 MED ORDER — MEDROXYPROGESTERONE ACETATE 150 MG/ML IM SUSP
150.0000 mg | Freq: Once | INTRAMUSCULAR | Status: AC
  Administered 2018-12-08: 15:00:00 150 mg via INTRAMUSCULAR

## 2019-01-08 ENCOUNTER — Encounter: Payer: Self-pay | Admitting: *Deleted

## 2019-01-21 ENCOUNTER — Other Ambulatory Visit: Payer: Self-pay | Admitting: Family Medicine

## 2019-01-21 DIAGNOSIS — M62830 Muscle spasm of back: Secondary | ICD-10-CM

## 2019-02-07 NOTE — Progress Notes (Signed)
Letter mailed from [facility] to notify of normal mammogram results.  Patient to return in one year for annual screening. Phoned patient to offer High Risk Clinic visit paid through [employer]. Copy to [facility].

## 2019-03-02 ENCOUNTER — Other Ambulatory Visit: Payer: Self-pay

## 2019-03-02 ENCOUNTER — Ambulatory Visit (INDEPENDENT_AMBULATORY_CARE_PROVIDER_SITE_OTHER): Payer: Self-pay

## 2019-03-02 VITALS — Temp 97.0°F

## 2019-03-02 DIAGNOSIS — Z3042 Encounter for surveillance of injectable contraceptive: Secondary | ICD-10-CM

## 2019-03-02 MED ORDER — MEDROXYPROGESTERONE ACETATE 150 MG/ML IM SUSP
150.0000 mg | Freq: Once | INTRAMUSCULAR | Status: AC
  Administered 2019-03-02: 15:00:00 150 mg via INTRAMUSCULAR

## 2019-03-02 NOTE — Progress Notes (Signed)
Pt here for depo which was given IM left hip.  NDC# [phone]

## 2019-05-25 ENCOUNTER — Other Ambulatory Visit: Payer: Self-pay

## 2019-05-25 ENCOUNTER — Ambulatory Visit: Payer: Self-pay

## 2019-05-25 DIAGNOSIS — Z3042 Encounter for surveillance of injectable contraceptive: Secondary | ICD-10-CM

## 2019-05-25 MED ORDER — MEDROXYPROGESTERONE ACETATE 150 MG/ML IM SUSP
150.0000 mg | Freq: Once | INTRAMUSCULAR | Status: AC
  Administered 2019-05-25: 150 mg via INTRAMUSCULAR

## 2019-05-25 NOTE — Progress Notes (Signed)
Patient presents today for Depo Provera injection within dates. Given IM RUOQ. Patient tolerated well. 

## 2019-05-30 ENCOUNTER — Other Ambulatory Visit: Payer: Self-pay | Admitting: Family Medicine

## 2019-05-30 DIAGNOSIS — M62830 Muscle spasm of back: Secondary | ICD-10-CM

## 2019-05-30 NOTE — Telephone Encounter (Signed)
Requested medications are due for refill today?  Yes - this refill cannot be delegated.    Requested medications are on active medication list?  Yes  Last Refill:  01/22/2019  # 60 with one refill   Future visit scheduled?  NO   Notes to Clinic:  This refill cannot be delegated.  Last visit 10 months ago.

## 2019-07-23 NOTE — Progress Notes (Signed)
Gynecology Annual Exam  PCP: Smitty Cords, DO  Chief Complaint:  Chief Complaint  Patient presents with  . Gynecologic Exam    History of Present Illness: Patient is a 49 y.o. WF G1P0101 who  presents for her annual exam and Depo injection. The patient has no gyn complaints today. She continues to experience episodes of mild RUQ pain, mostly when she leans against something that presses on that area. She has been noticing this over the last 18  Months or so. The pain does not seem to be linked to eating. Denies nausea and vomiting with pain.  She is amenorrheic on Depo Provera. Her last injection was 05/25/2019.  She has no spotting. The patient's past medical history is notable for a history of multinodular goiter for which she had a total thyroidectomy and is currently on levothyroxine 150 mcg M-Sat She is being followed by her PCP,  Dr Althea Charon in [facility]. Since her last annual exam 06/29/2018, she has had no significant changes to her health. Her last Pap smear was 04/27/2016 and was NIL/negative HRHPV The patient is not currently sexually active. She currently uses Depo for cycle control.  Her last mammogram was 12/07/2018 and was negative. She gets her mammograms thru the [facility] program. The patient does perform monthly self breast exams.  There is a notable family history of breast cancer in her mother. Genetic testing has not been done. There is no history of ovarian cancer in her family. Last colonoscopy 2014 was normal. Next due in 2024 The patient has regular exercise: no, but she gets her steps at work.   She does not smoke. She does not drink alcohol. She may get adequate calcium in her diet and supplements. Her routine labs will be thru her PCP.     Review of Systems: Review of Systems  Constitutional: Negative for chills, fever and weight loss.  HENT: Negative for congestion, sinus pain and sore throat.   Eyes: Negative for blurred vision  and pain.  Respiratory: Negative for hemoptysis, shortness of breath and wheezing.   Cardiovascular: Negative for chest pain, palpitations and leg swelling.  Gastrointestinal: Negative for abdominal pain, blood in stool, diarrhea, heartburn, nausea and vomiting.  Genitourinary: Negative for dysuria, frequency, hematuria and urgency.       Positive for amenorrhea  Musculoskeletal: Negative for back pain, joint pain and myalgias.  Skin: Negative for itching and rash.  Neurological: Negative for dizziness, tingling and headaches.  Endo/Heme/Allergies: Positive for environmental allergies. Negative for polydipsia. Does not bruise/bleed easily.       Negative for hirsutism   Psychiatric/Behavioral: Negative for depression. The patient is not nervous/anxious and does not have insomnia.    Past Medical History:  Past Medical History:  Diagnosis Date  . Colon polyps   . Goiter   . Lactose intolerance     Past Surgical History:  Past Surgical History:  Procedure Laterality Date  . APPENDECTOMY    . CESAREAN SECTION  1998   breech, PPROM at 36 weeks  . COLONOSCOPY     polyps in 2004; 2013/2014 colonoscopy WNL  . TOTAL THYROIDECTOMY       Family History:  Family History  Problem Relation Age of Onset  . Breast cancer Mother 85  . Thyroid cancer Mother 32  . Alcohol abuse Father   . Alcohol abuse Paternal Grandfather     Social History:  Social History   Socioeconomic History  . Marital status:  Single    Spouse name: Not on file  . Number of children: 1  . Years of education: 43  . Highest education level: Not on file  Occupational History  . 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 Smoking status: Never Smoker  . Smokeless tobacco: Never Used  Vaping Use  . Vaping Use: Never used  Substance and Sexual Activity  . Alcohol use: No  . Drug use: No  . Sexual activity: Not Currently    Birth control/protection: Injection  Other Topics Concern  . Not on file    Social History Narrative  . Not on file   Social Determinants of Health   Financial Resource Strain:   . Difficulty of Paying Living Expenses:   Food Insecurity:   . Worried About Programme researcher, broadcasting/film/video in the Last Year:   . Barista in the Last Year:   Transportation Needs:   . Freight forwarder (Medical):   Marland Kitchen Lack of Transportation (Non-Medical):   Physical Activity:   . Days of Exercise per Week:   . Minutes of Exercise per Session:   Stress:   . Feeling of Stress :   Social Connections:   . Frequency of Communication with Friends and Family:   . Frequency of Social Gatherings with Friends and Family:   . Attends Religious Services:   . Active Member of Clubs or Organizations:   . Attends Banker Meetings:   Marland Kitchen Marital Status:   Intimate Partner Violence:   . Fear of Current or Ex-Partner:   . Emotionally Abused:   Marland Kitchen Physically Abused:   . Sexually Abused:     Allergies:  No Known Allergies  Medications: Prior to Admission medications   Medication Sig Start Date End Date Taking? Authorizing Provider  levothyroxine (SYNTHROID, LEVOTHROID) 150 MCG tablet Take by mouth. 05/20/15 05/19/16 Yes Historical Provider, MD  medroxyPROGESTERone (DEPO-PROVERA) 150 MG/ML injection Inject 150 mg into the muscle every 3 (three) months.   Yes Historical Provider, MD   Isagenix supplements   Physical Exam Vitals:BP 120/72   Pulse 71   Resp 18   Ht 5' (1.524 m)   Wt 186 lb 6.4 oz (84.6 kg)   SpO2 97%   BMI 36.40 kg/m   General: pleasant WF in NAD HEENT: normocephalic, anicteric Thyroid: no enlargement, no palpable nodules Pulmonary: No increased work of breathing, CTAB Cardiovascular: RRR without murmur Breast: Breast symmetrical, no tenderness, no palpable nodules or masses, no skin or nipple retraction present, no nipple discharge.  No axillary, infraclavicular, or supraclavicular lymphadenopathy. Abdomen: soft, non-tender, non-distended.  No RUQ  tenderness to palpation. Umbilicus without lesions.  No hepatomegaly or masses palpable. No evidence of hernia  Genitourinary:  External: Normal external female genitalia.  Normal urethral meatus, normal Bartholin's and Skene's glands.    Vagina: Normal vaginal mucosa, no evidence of prolapse.    Cervix: Grossly normal in appearance, no bleeding  Uterus: Non-enlarged, mobile, normal contour, AV, NT  Adnexa: ovaries non-enlarged, no adnexal masses  Rectal: deferred  Lymphatic: no evidence of inguinal lymphadenopathy Extremities: no edema, erythema, or tenderness Neurologic: Grossly intact Psychiatric: mood appropriate, affect full Skin:  no rash or lesions      Assessment: 49 y.o. G1P0101 well woman exam Amenorrhea due to Depo Provera Family history of breast cancer-mother  Plan:   1) Mammogram - recommend yearly screening mammograms.  [facility]CP will contact Ellenie to schedule after 12/22/19  2) Discussed stopping Depo Provera  next year (last dose around January) to see if menopausal. Can get [facility] and [facility] when she returns for her annual.. Refill Depo Provera 150 mgm IM q 12 weeks.  3) Pap done and sent to [facility].Marland Kitchen Desires Pap smear every 3 years after discussion.   4) Routine healthcare maintenance including cholesterol, diabetes screening to be managed by PCP. Recommend abdominal ultrasound to R/O gallstones as an etiology for her RUQ pain. Patient to discuss with her PCP.  5) Colon cancer screening: colonoscopy UTD. Next due in 2024  5) RTO 1 year for annual exam.   Dalia Heading, CNM

## 2019-07-24 ENCOUNTER — Encounter: Payer: Self-pay | Admitting: Certified Nurse Midwife

## 2019-07-24 ENCOUNTER — Ambulatory Visit (INDEPENDENT_AMBULATORY_CARE_PROVIDER_SITE_OTHER): Payer: Self-pay | Admitting: Certified Nurse Midwife

## 2019-07-24 ENCOUNTER — Other Ambulatory Visit: Payer: Self-pay

## 2019-07-24 VITALS — BP 120/72 | HR 71 | Resp 18 | Ht 60.0 in | Wt 186.4 lb

## 2019-07-24 DIAGNOSIS — Z01419 Encounter for gynecological examination (general) (routine) without abnormal findings: Secondary | ICD-10-CM

## 2019-07-24 DIAGNOSIS — Z803 Family history of malignant neoplasm of breast: Secondary | ICD-10-CM

## 2019-07-24 DIAGNOSIS — Z1231 Encounter for screening mammogram for malignant neoplasm of breast: Secondary | ICD-10-CM

## 2019-07-24 DIAGNOSIS — Z3042 Encounter for surveillance of injectable contraceptive: Secondary | ICD-10-CM

## 2019-07-24 DIAGNOSIS — Z124 Encounter for screening for malignant neoplasm of cervix: Secondary | ICD-10-CM

## 2019-07-24 MED ORDER — MEDROXYPROGESTERONE ACETATE 150 MG/ML IM SUSP: 150.0000 mg | INTRAMUSCULAR | 2 refills | Status: DC

## 2019-07-25 ENCOUNTER — Encounter: Payer: Self-pay | Admitting: Family Medicine

## 2019-07-25 ENCOUNTER — Ambulatory Visit (INDEPENDENT_AMBULATORY_CARE_PROVIDER_SITE_OTHER): Payer: Self-pay | Admitting: Family Medicine

## 2019-07-25 VITALS — BP 136/70 | HR 73 | Temp 97.5°F | Resp 16 | Ht 60.0 in | Wt 186.6 lb

## 2019-07-25 DIAGNOSIS — M62830 Muscle spasm of back: Secondary | ICD-10-CM

## 2019-07-25 DIAGNOSIS — F5101 Primary insomnia: Secondary | ICD-10-CM

## 2019-07-25 DIAGNOSIS — E039 Hypothyroidism, unspecified: Secondary | ICD-10-CM

## 2019-07-25 DIAGNOSIS — E669 Obesity, unspecified: Secondary | ICD-10-CM

## 2019-07-25 MED ORDER — BACLOFEN 10 MG PO TABS: ORAL_TABLET | ORAL | 5 refills | Status: DC

## 2019-07-25 NOTE — Patient Instructions (Addendum)
Thank you for coming to the office today.  Request colonoscopy record.  Likely strained rib or muscle in that area with repetitive bending.  Does not sound like gallbladder today.  Re ordered Baclofen muscle relaxant.  Will order the Levothyroxine based on lab test result soon. 90 day  Labs ordered today  Check cost for self pay (it should be at a 50% discount as well)  Chemistry CMET CBC Blood count Lipid cholesterol A1c (diabetes screen)  TSH + Free T4 - thyroid tests   DUE for FASTING BLOOD WORK (no food or drink after midnight before the lab appointment, only water or coffee without cream/sugar on the morning of)  SCHEDULE "Lab Only" visit in the morning at the clinic for lab draw in 1-2 DAYS  For Lab Results, once available within 2-3 days of blood draw, you can can log in to MyChart online to view your results and a brief explanation. Also, we can discuss results at next follow-up visit.  Please schedule a Follow-up Appointment to: Return in about 1 year (around 07/24/2020) for [employer]  If you have any other questions or concerns, please feel free to call the office or send a message through MyChart. You may also schedule an earlier appointment if necessary.  Additionally, you may be receiving a survey about your experience at our office within a few days to 1 week by e-mail or mail. We value your feedback.  Saralyn Pilar, DO [facility], [place]

## 2019-07-25 NOTE — Progress Notes (Signed)
Subjective:    Patient ID: Toni Morris, female    DOB: 1970-12-11, 49 y.o.   MRN: [id number]  Toni Morris is a 49 y.o. female presenting on 07/25/2019 for Hypothyroidism   HPI   Hypothyroidism / Obesity BMI >36 Chronic problem since 2011, dx with multinodular goiter in 2011 on thyroid ultrasound, and then next year 07/2010 had total thyroidectomy 07/15/10, benign surgical pathology, then on replacement thyroid hormone, levothyroxine and later synthroid, then back to levothyroxine dosage 100s to 150s. Last year was on daily except on Sundays - Currently taking Levothyroxine daily, except Sunday - Last lab TSH and Free T4 normalized. - She is due for lab today. Will need refill on med. - Denies feeling any significant change or symptoms from hypothyroidism Denies abnormal weight gain, skin or hair or nail changes, temperature changes   PMH Insomnia See last note for background history. No new concerns. Has some persistent insomnia.  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 Says this is repetitive. Previously seen by GYN thought maybe gallbladder issues. However she does not endorse nausea vomiting or food related or provoked symptoms.  Denies radiating pain into legs, numbness tingling weakness, fall or injury trauma    Health Maintenance: Followed by [facility] Depo medrol upcoming and testing for menopausal in future  B[facility]CP  mammogram, reviewed.  Last colonoscopy done 2014, [facility]. Anticipate would be due for repeat within 5-10 years, pending review of record, therefore may offer options once reviewed.   Depression screen [facility] 2/9 07/26/2019 07/26/2018  Decreased Interest 0 0  Down, Depressed, Hopeless 0 0  PHQ - 2 Score 0 0    Past Medical History:  Diagnosis Date  . Colon polyps   . Goiter   . Lactose intolerance    Past Surgical History:  Procedure Laterality Date  . APPENDECTOMY    . CESAREAN SECTION  1998   breech, PPROM at 36 weeks  . COLONOSCOPY     polyps in 2004; 2013/2014 colonoscopy WNL  . TOTAL THYROIDECTOMY     Social History   Socioeconomic History  . Marital status: Single    Spouse name: Not on file  . Number of children: 1  . Years of education: 45  . Highest education level: Not on file  Occupational History  . 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 Smoking status: Never Smoker  . Smokeless tobacco: Never Used  Vaping Use  . Vaping Use: Never used  Substance and Sexual Activity  . Alcohol use: No  . Drug use: No  . Sexual activity: Not Currently    Birth control/protection: Injection  Other Topics Concern  . Not on file  Social History Narrative  . Not on file   Social Determinants of Health   Financial Resource Strain:   . Difficulty of Paying Living Expenses:   Food Insecurity:   . Worried About Programme researcher, broadcasting/film/video in the Last Year:   . [employer] of  Food in the Last Year:   Transportation Needs:   . Freight forwarder (Medical):   Marland Kitchen Lack of Transportation (Non-Medical):   Physical Activity:   . Days of Exercise per Week:   . Minutes of Exercise per Session:   Stress:   . Feeling of Stress :   Social Connections:   . Frequency of Communication with Friends and Family:   . Frequency of Social Gatherings with Friends and Family:   . Attends Religious Services:   . Active Member of Clubs or Organizations:   . Attends Tax inspector Meetings:   Marland Kitchen Marital Status:   Intimate Partner Violence:   . Fear of Current or Ex-Partner:   . Emotionally Abused:   Marland Kitchen Physically Abused:   . Sexually Abused:    Family History  Problem Relation Age of Onset  . Breast cancer Mother 37  . Thyroid cancer Mother 7  . Alcohol abuse Father   . Alcohol abuse Paternal Grandfather    Current Outpatient Medications on File Prior to Visit  Medication Sig  . aspirin-acetaminophen-caffeine (EXCEDRIN MIGRAINE) 250-250-65 MG tablet Take 2 tablets by mouth daily.  Marland Kitchen levothyroxine (SYNTHROID) 150 MCG tablet Take 1 tablet (150 mcg total) by mouth daily before breakfast.  . medroxyPROGESTERone (DEPO-PROVERA) 150 MG/ML injection Inject 1 mL (150 mg total) into the muscle every 3 (three) months.  . Melatonin 5 MG CAPS Take by mouth.   No current facility-administered medications on file prior to visit.    Review of Systems  Constitutional: Negative for activity change, appetite change, chills, diaphoresis, fatigue and fever.  HENT: Negative for congestion and hearing loss.   Eyes: Negative for visual disturbance.  Respiratory: Negative for apnea, cough, chest tightness, shortness of breath and wheezing.   Cardiovascular: Negative for chest pain, palpitations and leg swelling.  Gastrointestinal: Negative for abdominal pain, anal bleeding, blood in stool, constipation, diarrhea, nausea and vomiting.  Endocrine: Negative for cold intolerance.  Genitourinary: Negative for difficulty urinating, dysuria, frequency and hematuria.  Musculoskeletal: Negative for arthralgias and neck pain.       Rib / side pain  Skin: Negative for rash.  Allergic/Immunologic: Negative for environmental allergies.  Neurological: Negative for dizziness, weakness, light-headedness, numbness and headaches.  Hematological: Negative for adenopathy.  Psychiatric/Behavioral: Negative for behavioral problems, dysphoric mood and sleep disturbance.   Per HPI unless  specifically indicated above     Objective:    BP 136/70 (BP Location: Left Arm, Cuff Size: Normal)   Pulse 73   Temp (!) 97.5 F (36.4 C) (Temporal)   Resp 16   Ht 5' (1.524 m)   Wt 186 lb 9.6 oz (84.6 kg)   SpO2 100%   BMI 36.44 kg/m   Wt Readings from Last 3 Encounters:  07/25/19 186 lb 9.6 oz (84.6 kg)  07/24/19 186 lb 6.4 oz (84.6 kg)  12/06/18 192 lb (87.1 kg)    Physical Exam Vitals and nursing note reviewed.  Constitutional:      General: She is not in acute distress.    Appearance: She is well-developed. She is obese. She is not diaphoretic.     Comments: Well-appearing, comfortable, cooperative  HENT:     Head: Normocephalic and atraumatic.  Eyes:     General:        Right eye: No discharge.        Left eye: No discharge.     Conjunctiva/sclera: Conjunctivae normal.     Pupils: Pupils are equal, round, and  reactive to light.  Neck:     Thyroid: No thyromegaly.     Vascular: No carotid bruit.  Cardiovascular:     Rate and Rhythm: Normal rate and regular rhythm.     Heart sounds: Normal heart sounds. No murmur heard.   Pulmonary:     Effort: Pulmonary effort is normal. No respiratory distress.     Breath sounds: Normal breath sounds. No wheezing or rales.  Abdominal:     General: Bowel sounds are normal. There is no distension.     Palpations: Abdomen is soft. There is no mass.     Tenderness: There is no abdominal tenderness. There is no rebound.     Comments: Negative RUQ Murphy's sign or pain on exam. Only reproduced over rib  Musculoskeletal:        General: Tenderness (reproduced over Right lower ribcage. worse with rotation and flexion) present. Normal range of motion.     Cervical back: Normal range of motion and neck supple.     Comments: Upper / Lower Extremities: - Normal muscle tone, strength bilateral upper extremities 5/5, lower extremities 5/5  Lymphadenopathy:     Cervical: No cervical adenopathy.  Skin:    General: Skin is warm and dry.      Findings: No erythema or rash.  Neurological:     Mental Status: She is alert and oriented to person, place, and time.     Comments: Distal sensation intact to light touch all extremities  Psychiatric:        Behavior: Behavior normal.     Comments: Well groomed, good eye contact, normal speech and thoughts        Results for orders placed or performed in visit on 07/26/18  TSH  Result Value Ref Range   TSH 2.35 mIU/L  T4, free  Result Value Ref Range   Free T4 1.3 0.8 - 1.8 ng/dL      Assessment & Plan:   Problem List Items Addressed This Visit    Obesity (BMI 35.0-39.9 without comorbidity)    Encourage lifestyle diet exercise wt loss      Relevant Orders   Hemoglobin A1c   CBC with Differential/Platelet   COMPLETE METABOLIC PANEL WITH GFR   Lipid panel   Insomnia    Stable chronic problem No significant changes Continue lifestyle / sleep hygiene OTC PRN      Relevant Orders   COMPLETE METABOLIC PANEL WITH GFR   Hypothyroidism (acquired) - Primary    Currently controlled hypothyroid On levothyroxine 122mcg daily - due refill once lab results reviewed  Labs today fasting. TSH Free T4, can check yearly      Relevant Orders   COMPLETE METABOLIC PANEL WITH GFR   TSH   T4, free    Other Visit Diagnoses    Muscle spasm of back       Relevant Medications   baclofen (LIORESAL) 10 MG tablet      Updated Health Maintenance information - Request record last Colonoscopy 2014 [facility]. Will offer screening for colon CA when next indicated. - Cervical CA Screening done, pap 07/24/19 awaiting results. Labs drawn fasting, f/u results Encouraged improvement to lifestyle with diet and exercise - Goal of weight loss   Meds ordered this encounter  Medications  . baclofen (LIORESAL) 10 MG tablet    Sig: TAKE 1/2 TO 1 TABLET(5 TO 10 MG) BY MOUTH TWICE DAILY AS NEEDED FOR MUSCLE SPASMS    Dispense:  60 tablet  Refill:  5    Follow up plan: Return in about 1  year (around 07/24/2020) for Yearly Checkup.  Will order labs after visit, after discussion with patient.  Saralyn Pilar, DO [facility] 07/25/2019, 1:52 PM

## 2019-07-26 NOTE — Assessment & Plan Note (Signed)
Stable chronic problem No significant changes Continue lifestyle / sleep hygiene OTC PRN

## 2019-07-26 NOTE — Assessment & Plan Note (Signed)
Currently controlled hypothyroid On levothyroxine daily - due refill once lab results reviewed  Labs today fasting. TSH Free T4, can check yearly

## 2019-07-26 NOTE — Assessment & Plan Note (Signed)
Encourage lifestyle diet exercise wt loss 

## 2019-07-28 LAB — COMPLETE METABOLIC PANEL WITH GFR
AG Ratio: 1.9 (calc) (ref 1.0–2.5)
ALT: 13 U/L (ref 6–29)
AST: 16 U/L (ref 10–35)
Albumin: 4.3 g/dL (ref 3.6–5.1)
Alkaline phosphatase (APISO): 53 U/L (ref 37–153)
BUN: 8 mg/dL (ref 7–25)
CO2: 25 mmol/L (ref 20–32)
Calcium: 9.5 mg/dL (ref 8.6–10.4)
Chloride: 107 mmol/L (ref 98–110)
Creat: 0.65 mg/dL (ref 0.50–1.05)
GFR, Est African American: 120 mL/min/{1.73_m2} (ref >=60)
GFR, Est Non African American: 104 mL/min/{1.73_m2} (ref >=60)
Globulin: 2.3 g/dL (calc) (ref 1.9–3.7)
Glucose, Bld: 99 mg/dL (ref 65–99)
Potassium: 4.5 mmol/L (ref 3.5–5.3)
Sodium: 140 mmol/L (ref 135–146)
Total Bilirubin: 0.4 mg/dL (ref 0.2–1.2)
Total Protein: 6.6 g/dL (ref 6.1–8.1)

## 2019-07-28 LAB — CBC WITH DIFFERENTIAL/PLATELET
Absolute Monocytes: 577 cells/uL (ref 200–950)
Basophils Absolute: 37 cells/uL (ref 0–200)
Basophils Relative: 0.5 %
Eosinophils Absolute: 170 cells/uL (ref 15–500)
Eosinophils Relative: 2.3 %
HCT: 38.5 % (ref 35.0–45.0)
Hemoglobin: 12.8 g/dL (ref 11.7–15.5)
Lymphs Abs: 3241 cells/uL (ref 850–3900)
MCH: 29.7 pg (ref 27.0–33.0)
MCHC: 33.2 g/dL (ref 32.0–36.0)
MCV: 89.3 fL (ref 80.0–100.0)
MPV: 11.1 fL (ref 7.5–12.5)
Monocytes Relative: 7.8 %
Neutro Abs: 3374 cells/uL (ref 1500–7800)
Neutrophils Relative %: 45.6 %
Platelets: 307 10*3/uL (ref 140–400)
RBC: 4.31 10*6/uL (ref 3.80–5.10)
RDW: 12.6 % (ref 11.0–15.0)
Total Lymphocyte: 43.8 %
WBC: 7.4 10*3/uL (ref 3.8–10.8)

## 2019-07-28 LAB — LIPID PANEL
Cholesterol: 212 mg/dL — ABNORMAL HIGH (ref <200)
HDL: 44 mg/dL — ABNORMAL LOW (ref >=50)
LDL Cholesterol (Calc): 138 mg/dL (calc) — ABNORMAL HIGH
Non-HDL Cholesterol (Calc): 168 mg/dL (calc) — ABNORMAL HIGH (ref <130)
Total CHOL/HDL Ratio: 4.8 (calc) (ref <5.0)
Triglycerides: 165 mg/dL — ABNORMAL HIGH (ref <150)

## 2019-07-28 LAB — T4, FREE: Free T4: 1.3 ng/dL (ref 0.8–1.8)

## 2019-07-28 LAB — TSH: TSH: 0.61 mIU/L

## 2019-07-28 LAB — HEMOGLOBIN A1C
Hgb A1c MFr Bld: 5.7 % of total Hgb — ABNORMAL HIGH (ref <5.7)
Mean Plasma Glucose: 117 (calc)
eAG (mmol/L): 6.5 (calc)

## 2019-07-29 DIAGNOSIS — Z803 Family history of malignant neoplasm of breast: Secondary | ICD-10-CM | POA: Insufficient documentation

## 2019-07-30 ENCOUNTER — Encounter: Payer: Self-pay | Admitting: Family Medicine

## 2019-07-30 ENCOUNTER — Other Ambulatory Visit: Payer: Self-pay | Admitting: Family Medicine

## 2019-07-30 DIAGNOSIS — R7309 Other abnormal glucose: Secondary | ICD-10-CM | POA: Insufficient documentation

## 2019-07-30 DIAGNOSIS — E039 Hypothyroidism, unspecified: Secondary | ICD-10-CM

## 2019-07-30 MED ORDER — LEVOTHYROXINE SODIUM 150 MCG PO TABS: 150.0000 ug | ORAL_TABLET | Freq: Every day | ORAL | 3 refills | Status: DC

## 2019-08-02 ENCOUNTER — Encounter: Payer: Self-pay | Admitting: Family Medicine

## 2019-08-09 ENCOUNTER — Telehealth: Payer: Self-pay | Admitting: *Deleted

## 2019-08-14 ENCOUNTER — Other Ambulatory Visit: Payer: Self-pay | Admitting: Certified Nurse Midwife

## 2019-08-14 DIAGNOSIS — Z3042 Encounter for surveillance of injectable contraceptive: Secondary | ICD-10-CM

## 2019-08-14 DIAGNOSIS — Z01419 Encounter for gynecological examination (general) (routine) without abnormal findings: Secondary | ICD-10-CM

## 2019-08-15 ENCOUNTER — Ambulatory Visit (INDEPENDENT_AMBULATORY_CARE_PROVIDER_SITE_OTHER): Payer: Self-pay

## 2019-08-15 ENCOUNTER — Other Ambulatory Visit: Payer: Self-pay

## 2019-08-15 DIAGNOSIS — Z3042 Encounter for surveillance of injectable contraceptive: Secondary | ICD-10-CM

## 2019-08-15 MED ORDER — MEDROXYPROGESTERONE ACETATE 150 MG/ML IM SUSP
150.0000 mg | Freq: Once | INTRAMUSCULAR | Status: AC
  Administered 2019-08-15: 150 mg via INTRAMUSCULAR

## 2019-08-15 NOTE — Progress Notes (Signed)
Pt here for depo which was given Im left glut.  NDC# [phone]

## 2019-09-26 NOTE — Addendum Note (Signed)
Addended by: Farrel Conners on: 09/26/2019 01:14 PM   Modules accepted: Orders

## 2019-11-08 ENCOUNTER — Ambulatory Visit: Payer: Self-pay

## 2019-11-08 ENCOUNTER — Other Ambulatory Visit: Payer: Self-pay

## 2019-11-08 DIAGNOSIS — Z3042 Encounter for surveillance of injectable contraceptive: Secondary | ICD-10-CM

## 2019-11-08 MED ORDER — MEDROXYPROGESTERONE ACETATE 150 MG/ML IM SUSP
150.0000 mg | Freq: Once | INTRAMUSCULAR | Status: AC
  Administered 2019-11-08: 150 mg via INTRAMUSCULAR

## 2019-11-08 NOTE — Progress Notes (Signed)
Pt here for depo which was given IM right glut.  NDC# [phone]

## 2020-01-31 ENCOUNTER — Other Ambulatory Visit: Payer: Self-pay

## 2020-01-31 ENCOUNTER — Ambulatory Visit (INDEPENDENT_AMBULATORY_CARE_PROVIDER_SITE_OTHER): Payer: Self-pay

## 2020-01-31 DIAGNOSIS — Z3042 Encounter for surveillance of injectable contraceptive: Secondary | ICD-10-CM

## 2020-01-31 MED ORDER — MEDROXYPROGESTERONE ACETATE 150 MG/ML IM SUSP
150.0000 mg | Freq: Once | INTRAMUSCULAR | Status: AC
  Administered 2020-01-31: 14:00:00 150 mg via INTRAMUSCULAR

## 2020-01-31 NOTE — Progress Notes (Signed)
Pt here for her last depo which was given IM right glut.  NDC# [phone].  Pt is to have blood work done to see if menopausal; CLG wanted pt to be off depo at least 12 weeks before having blood drawn.  Pt plans to have it drawn when she comes in for her annual.

## 2020-05-28 ENCOUNTER — Other Ambulatory Visit: Payer: Self-pay | Admitting: Family Medicine

## 2020-05-28 DIAGNOSIS — M62830 Muscle spasm of back: Secondary | ICD-10-CM

## 2020-05-28 NOTE — Telephone Encounter (Signed)
Requested medications are due for refill today.  yes  Requested medications are on the active medications list.  yes  Last refill. 07/25/2019  Future visit scheduled.   no  Notes to clinic.  Medication not delegated.

## 2020-07-27 ENCOUNTER — Other Ambulatory Visit: Payer: Self-pay | Admitting: Family Medicine

## 2020-07-27 DIAGNOSIS — E039 Hypothyroidism, unspecified: Secondary | ICD-10-CM

## 2020-07-27 NOTE — Telephone Encounter (Signed)
Refill is due.  Last RF: 07/30/19 #90 3 RF  Blood work due.  Message to pt via MyChart to make appt  Requested Prescriptions  Pending Prescriptions Disp Refills   levothyroxine (SYNTHROID) 150 MCG tablet [Pharmacy Med Name: LEVOTHYROXINE 0.150MG  ( ) TAB] 90 tablet 3    Sig: TAKE 1 TABLET(150 MCG) BY MOUTH DAILY BEFORE BREAKFAST      Endocrinology:  Hypothyroid Agents Failed - 07/27/2020  7:59 AM      Failed - TSH needs to be rechecked within 3 months after an abnormal result. Refill until TSH is due.      Failed - TSH in normal range and within 360 days    TSH  Date Value Ref Range Status  07/27/2019 0.61 mIU/L Final    Comment:              Reference Range .           > or = 20 Years  0.40-4.50 .                Pregnancy Ranges           First trimester    0.26-2.66           Second trimester   0.55-2.73           Third trimester    0.43-2.91           Failed - Valid encounter within last 12 months    Recent Outpatient Visits           1 year ago Hypothyroidism (acquired)   [facility] Smitty Cords, DO   2 years ago Hypothyroidism (acquired)   [facility], Netta Neat, DO

## 2020-08-11 ENCOUNTER — Other Ambulatory Visit: Payer: Self-pay

## 2020-08-11 ENCOUNTER — Ambulatory Visit: Payer: Self-pay | Admitting: Family Medicine

## 2020-08-11 ENCOUNTER — Encounter: Payer: Self-pay | Admitting: Family Medicine

## 2020-08-11 VITALS — BP 130/82 | HR 91 | Ht 60.0 in | Wt 185.8 lb

## 2020-08-11 DIAGNOSIS — E669 Obesity, unspecified: Secondary | ICD-10-CM

## 2020-08-11 DIAGNOSIS — R7309 Other abnormal glucose: Secondary | ICD-10-CM

## 2020-08-11 DIAGNOSIS — E039 Hypothyroidism, unspecified: Secondary | ICD-10-CM

## 2020-08-11 NOTE — Progress Notes (Signed)
Subjective:    Patient ID: Toni Morris, female    DOB: February 11, 1970, 50 y.o.   MRN: [id number]  Toni Morris is a 50 y.o. female presenting on 08/11/2020 for Hypothyroidism   HPI  Hypothyroidism / Obesity BMI >36 Chronic problem since 2011, dx with multinodular goiter in 2011 on thyroid ultrasound, and then next year 07/2010 had total thyroidectomy 07/15/10, benign surgical pathology, then on replacement thyroid hormone, levothyroxine and later synthroid, then back to levothyroxine dosage 100s to 150s.  - Currently taking Levothyroxine daily - every day - Last lab TSH and Free T4 normalized. 07/2019 Due for repeat labs, will return later this week for fasting lab. - Denies feeling any significant change or symptoms from hypothyroidism Denies abnormal weight gain, skin or hair or nail changes, temperature changes   A1c Elevated Result last time 07/2019 with A1c 5.7 Due for repeat.  PMH Insomnia See last note for background history. No new concerns. Has some persistent insomnia.    [facility], they are going to take her off Depo Provera and they will check hormone levels.  Health Maintenance: Due for Mammogram later this year, 2022, her last mammogram was done through [facility] program at [facility] approx August 2021, showed dense tissue required repeat image, but was negative. We will get a copy of that result.  Depression screen [facility] 2/9 08/11/2020 07/26/2019 07/26/2018  Decreased Interest 0 0 0  Down, Depressed, Hopeless 0 0 0  PHQ - 2 Score 0 0 0  Altered sleeping 3 - -  Tired, decreased energy 3 - -  Change in appetite 0 - -  Feeling bad or failure about yourself  0 - -  Trouble concentrating 0 - -  Moving slowly or fidgety/restless 0 - -  Suicidal thoughts 0 - -  PHQ-9 Score 6 - -  Difficult doing work/chores Not difficult at all - -    Past Medical History:  Diagnosis Date   Colon polyps    Goiter    Lactose intolerance    Past Surgical History:  Procedure  Laterality Date   APPENDECTOMY     CESAREAN SECTION  1998   breech, PPROM at 36 weeks   COLONOSCOPY     polyps in 2004; 2013/2014 colonoscopy WNL   TOTAL THYROIDECTOMY     Social History   Socioeconomic History   Marital status: Single    Spouse name: Not on file   Number of children: 1   Years of education: 16   Highest education level: Not on file  Occupational History   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  Tobacco Use   Smoking status: Never   Smokeless tobacco: Never  Vaping Use   Vaping Use: Never used  Substance and Sexual Activity   Alcohol use: No   Drug use: No   Sexual activity: Not Currently    Birth control/protection: Injection  Other Topics Concern   Not on file  Social History Narrative   Not on file   Social Determinants of Health   Financial Resource Strain: Not on file  Food Insecurity: Not on file  Transportation Needs: Not on file  Physical Activity: Not on file  Stress: Not on file  Social Connections: Not on file  Intimate Partner Violence: Not on file   Family History  Problem Relation Age of Onset   Breast cancer Mother 28   Thyroid cancer Mother 58       papillary thyroid carcinoma   Alcohol abuse  Father    Alcohol abuse Paternal Grandfather    Current Outpatient Medications on File Prior to Visit  Medication Sig   aspirin-acetaminophen-caffeine (EXCEDRIN MIGRAINE) 250-250-65 MG tablet Take 2 tablets by mouth daily.   baclofen (LIORESAL) 10 MG tablet TAKE 1/2 TO 1 TABLET(5 TO 10 MG) BY MOUTH TWICE DAILY AS NEEDED FOR MUSCLE SPASMS   levothyroxine (SYNTHROID) 150 MCG tablet TAKE 1 TABLET(150 MCG) BY MOUTH DAILY BEFORE BREAKFAST   medroxyPROGESTERone (DEPO-PROVERA) 150 MG/ML injection ADMINISTER 1 ML(150 MG) IN THE MUSCLE EVERY 3 MONTHS   Melatonin 5 MG CAPS Take by mouth.   No current facility-administered medications on file prior to visit.    Review of Systems Per HPI unless specifically indicated above     Objective:    BP  130/82 (BP Location: Left Arm, Cuff Size: Normal)   Pulse 91   Ht 5' (1.524 m)   Wt 185 lb 12.8 oz (84.3 kg)   SpO2 100%   BMI 36.29 kg/m   Wt Readings from Last 3 Encounters:  08/11/20 185 lb 12.8 oz (84.3 kg)  07/25/19 186 lb 9.6 oz (84.6 kg)  07/24/19 186 lb 6.4 oz (84.6 kg)    Physical Exam Vitals and nursing note reviewed.  Constitutional:      General: She is not in acute distress.    Appearance: She is well-developed. She is not diaphoretic.     Comments: Well-appearing, comfortable, cooperative  HENT:     Head: Normocephalic and atraumatic.  Eyes:     General:        Right eye: No discharge.        Left eye: No discharge.     Conjunctiva/sclera: Conjunctivae normal.  Neck:     Thyroid: No thyromegaly.     Comments: No thyromegaly or nodules Cardiovascular:     Rate and Rhythm: Normal rate and regular rhythm.     Heart sounds: Normal heart sounds. No murmur heard. Pulmonary:     Effort: Pulmonary effort is normal. No respiratory distress.     Breath sounds: Normal breath sounds. No wheezing or rales.  Musculoskeletal:        General: Normal range of motion.     Cervical back: Normal range of motion and neck supple.  Lymphadenopathy:     Cervical: No cervical adenopathy.  Skin:    General: Skin is warm and dry.     Findings: No erythema or rash.  Neurological:     Mental Status: She is alert and oriented to person, place, and time.  Psychiatric:        Behavior: Behavior normal.     Comments: Well groomed, good eye contact, normal speech and thoughts     Results for orders placed or performed in visit on 08/02/19  HM DEXA SCAN  Result Value Ref Range   HM Dexa Scan Normal   HM COLONOSCOPY  Result Value Ref Range   HM Colonoscopy See Report (in chart) See Report (in chart), Patient Reported      Assessment & Plan:   Problem List Items Addressed This Visit     Obesity (BMI 35.0-39.9 without comorbidity)    Encourage lifestyle diet exercise wt  loss       Hypothyroidism (acquired) - Primary    Due for labs Previously controlled hypothyroid On levothyroxine daily - due refill once lab results reviewed  Labs this week fasting TSH Free T4, can check yearly       Relevant Orders  TSH   T4, free   Elevated hemoglobin A1c    Mild elevated A1c 5.7 Goal to improve diet limit carb/starches Check this week       Relevant Orders   Hemoglobin A1c    No orders of the defined types were placed in this encounter.    Follow up plan: Return in about 1 year (around 08/11/2021) for fasting lab only on 7/14 815am, then 1 year follow-up Thyroid.  Saralyn Pilar, DO [facility] 08/11/2020, 1:56 PM

## 2020-08-11 NOTE — Assessment & Plan Note (Signed)
Due for labs Previously controlled hypothyroid On levothyroxine daily - due refill once lab results reviewed  Labs this week fasting TSH Free T4, can check yearly

## 2020-08-11 NOTE — Assessment & Plan Note (Signed)
Mild elevated A1c 5.7 Goal to improve diet limit carb/starches Check this week

## 2020-08-11 NOTE — Patient Instructions (Addendum)
Thank you for coming to the office today.  Total Thyroidectomy - 07/15/2010  Future Colon Cancer Screening.  DUE for FASTING BLOOD WORK (no food or drink after midnight before the lab appointment, only water or coffee without cream/sugar on the morning of)  Thursday 7/14  Will check Thyroid TSH + T4  Also recommend A1c sugar test for 3 month average.  If the cost on A1c is too much, you can skip it.  After we review thyroid results, if abnormal, let me know and we can come up with a dosage change plan. Otherwise will renew current dose for daily for 1 year, 90 day with additional refills.  If getting close to the end of your bottle and not heard back yet on the refill, let me know!   Please schedule a Follow-up Appointment to: Return in about 1 year (around 08/11/2021) for fasting lab only on 7/14 815am, then 1 year follow-up Thyroid.  If you have any other questions or concerns, please feel free to call the office or send a message through MyChart. You may also schedule an earlier appointment if necessary.  Additionally, you may be receiving a survey about your experience at our office within a few days to 1 week by e-mail or mail. We value your feedback.  Saralyn Pilar, DO [facility], [place]

## 2020-08-11 NOTE — Assessment & Plan Note (Signed)
Encourage lifestyle diet exercise wt loss 

## 2020-08-13 ENCOUNTER — Other Ambulatory Visit: Payer: Self-pay

## 2020-08-13 DIAGNOSIS — E039 Hypothyroidism, unspecified: Secondary | ICD-10-CM

## 2020-08-13 DIAGNOSIS — R7309 Other abnormal glucose: Secondary | ICD-10-CM

## 2020-08-14 ENCOUNTER — Other Ambulatory Visit: Payer: Self-pay

## 2020-08-15 LAB — HEMOGLOBIN A1C
Hgb A1c MFr Bld: 5.7 % of total Hgb — ABNORMAL HIGH (ref <5.7)
Mean Plasma Glucose: 117 mg/dL
eAG (mmol/L): 6.5 mmol/L

## 2020-08-15 LAB — T4, FREE: Free T4: 1.3 ng/dL (ref 0.8–1.8)

## 2020-08-15 LAB — TSH: TSH: 0.6 mIU/L

## 2020-08-18 ENCOUNTER — Other Ambulatory Visit: Payer: Self-pay

## 2020-09-10 ENCOUNTER — Other Ambulatory Visit: Payer: Self-pay

## 2020-09-10 ENCOUNTER — Encounter: Payer: Self-pay | Admitting: Family Medicine

## 2020-09-10 DIAGNOSIS — E039 Hypothyroidism, unspecified: Secondary | ICD-10-CM

## 2020-09-10 MED ORDER — LEVOTHYROXINE SODIUM 150 MCG PO TABS: 150.0000 ug | ORAL_TABLET | Freq: Every day | ORAL | 3 refills | Status: AC

## 2020-12-19 IMAGING — MG DIGITAL SCREENING BILAT W/ TOMO W/ CAD
8 series · 8 of 24 positions shown · non-contrast
Comparison: Previous exam(s).

CLINICAL DATA: Screening.

EXAM:
DIGITAL SCREENING BILATERAL MAMMOGRAM WITH TOMO AND CAD

[L CC synth-2D]
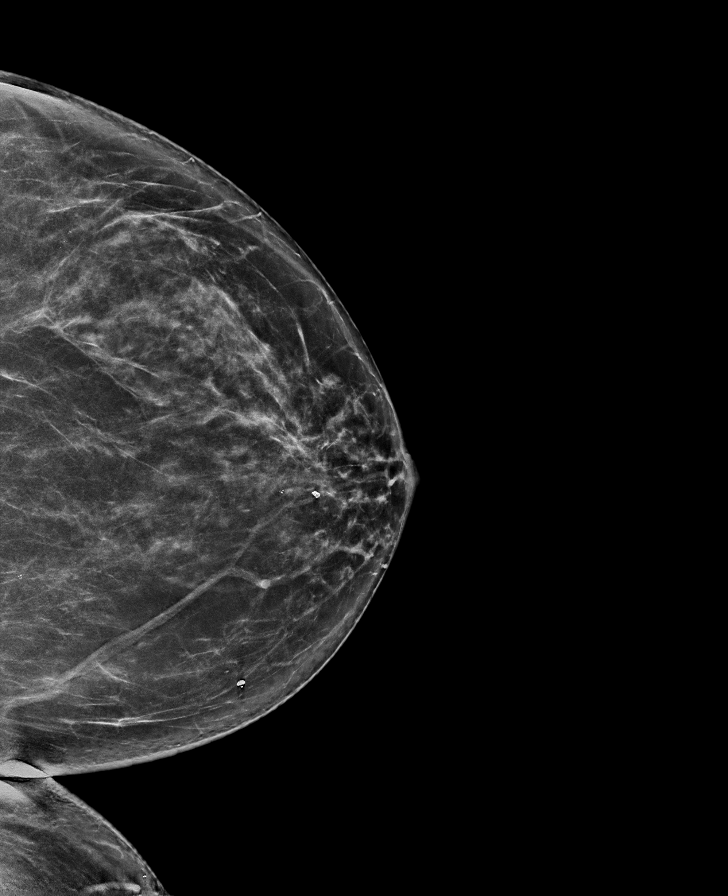

[L MLO synth-2D]
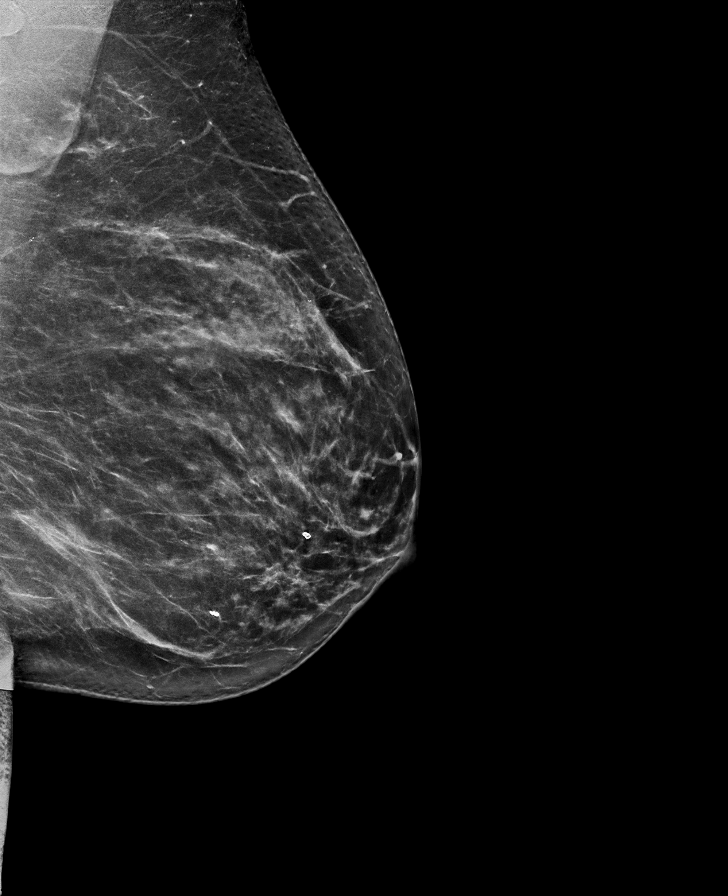

[R CC synth-2D]
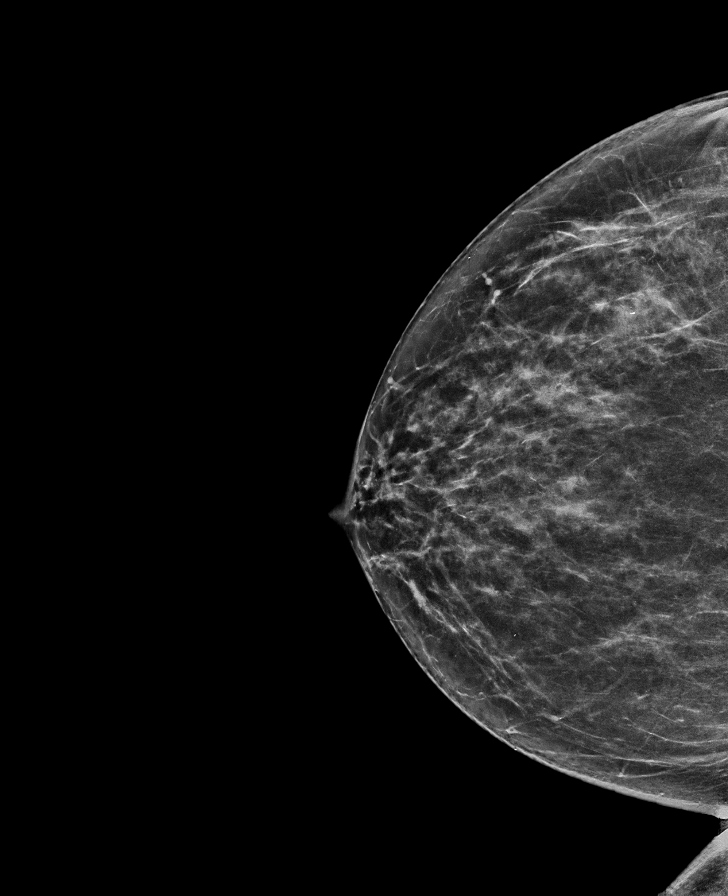

[R MLO synth-2D]
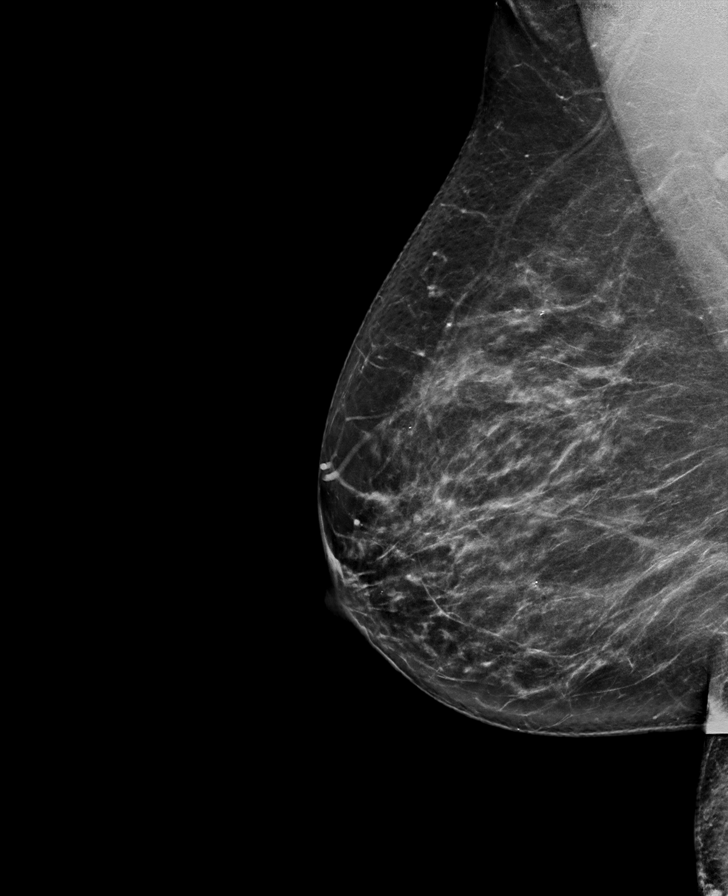

[R CC tomo · tomo slice 35/70.0]
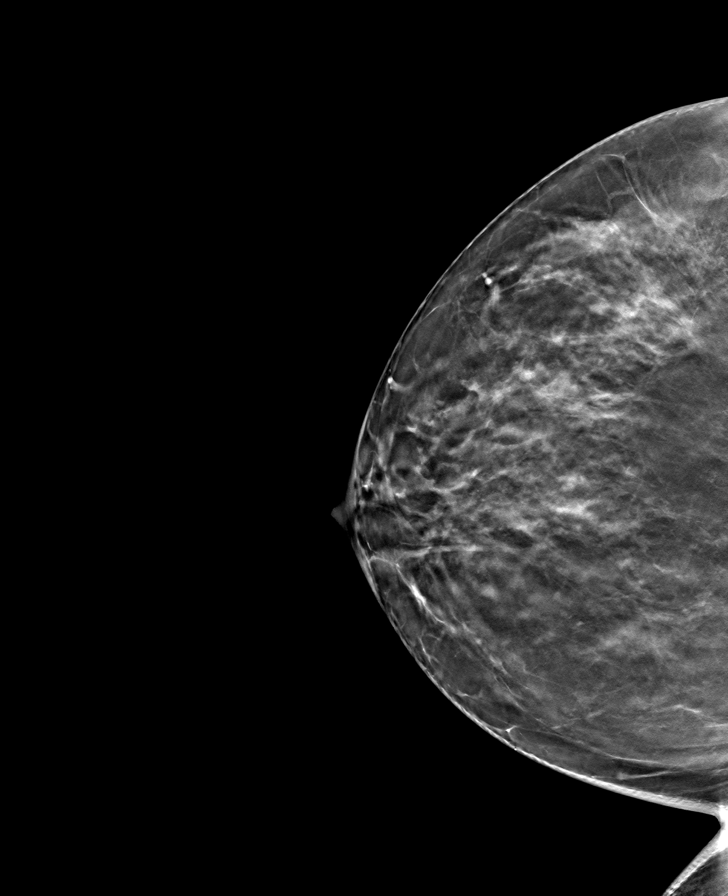

[L CC tomo · tomo slice 39/77.0]
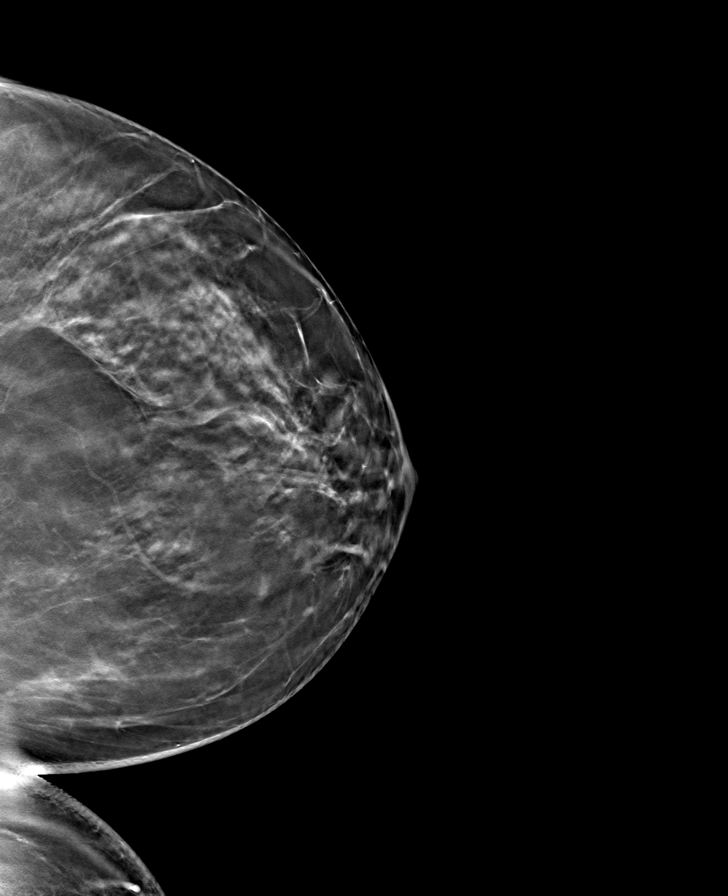

[L MLO tomo · tomo slice 41/81.0]
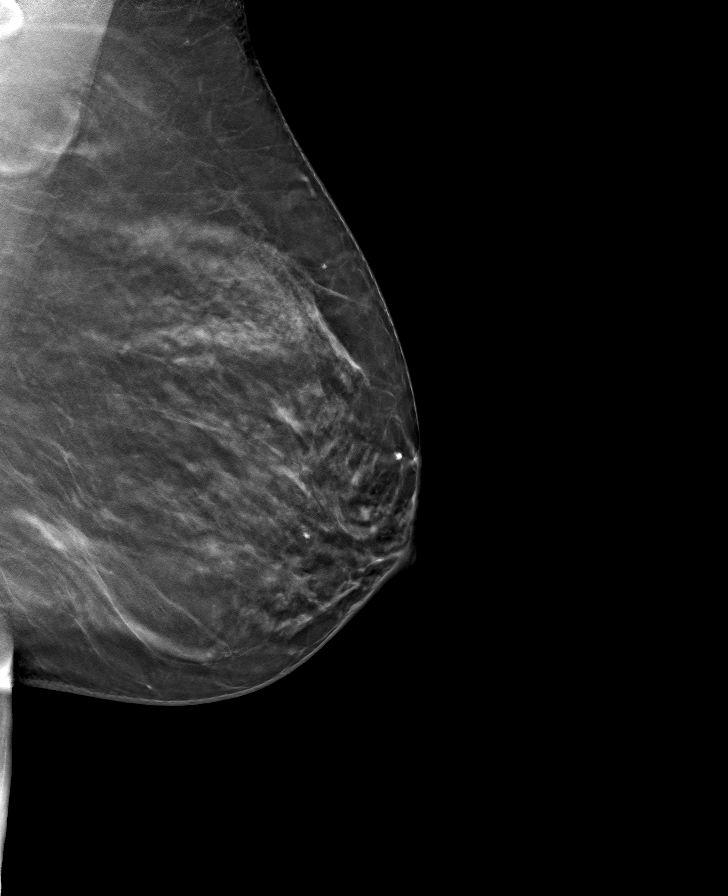

[R MLO tomo · tomo slice 41/81.0]
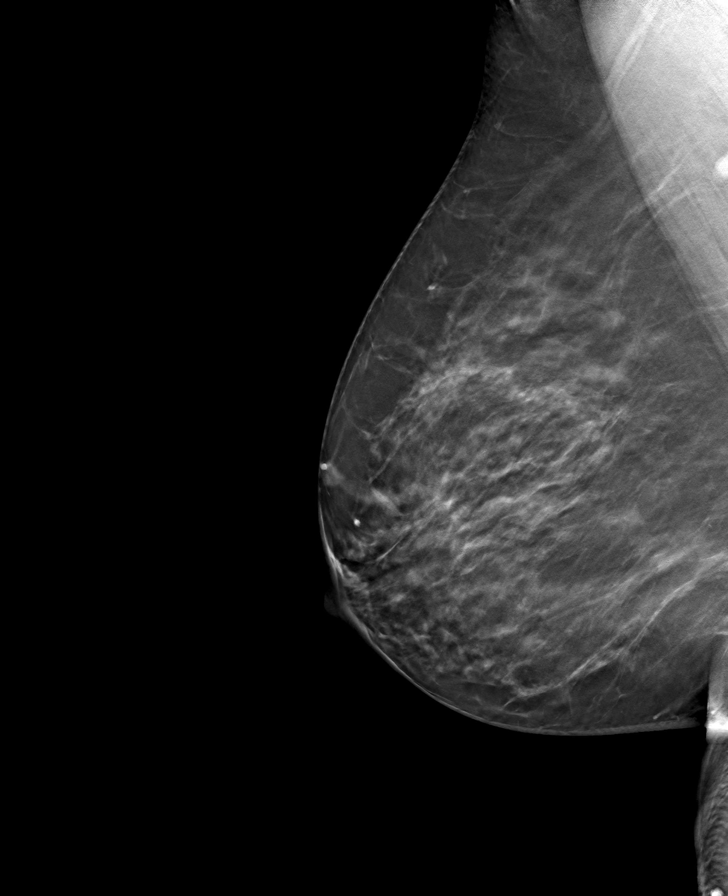

[8 of 24 positions shown; findings below may reference images not displayed]

ACR Breast Density Category b: There are scattered areas of
fibroglandular density.
FINDINGS: There are no findings suspicious for malignancy. Images were
processed with CAD.
IMPRESSION: No mammographic evidence of malignancy. A result letter of this
screening mammogram will be mailed directly to the patient.

RECOMMENDATION:
Screening mammogram in one year. (Code:CN-U-775)

BI-RADS CATEGORY  1: Negative.

## 2021-08-13 ENCOUNTER — Ambulatory Visit: Payer: Self-pay | Admitting: Family Medicine

## 2021-08-27 ENCOUNTER — Other Ambulatory Visit: Payer: Self-pay | Admitting: Family Medicine

## 2021-08-27 DIAGNOSIS — Z1231 Encounter for screening mammogram for malignant neoplasm of breast: Secondary | ICD-10-CM
